# Patient Record
Sex: Female | Born: 1955 | Hispanic: No | Marital: Married | Smoking: Never smoker
Health system: Southern US, Community
[De-identification: ages and names within clinical notes are randomized; demographics above are authoritative.]

## PROBLEM LIST (undated history)

## (undated) DIAGNOSIS — R519 Headache, unspecified: Secondary | ICD-10-CM

## (undated) DIAGNOSIS — R51 Headache: Secondary | ICD-10-CM

## (undated) DIAGNOSIS — R7611 Nonspecific reaction to tuberculin skin test without active tuberculosis: Secondary | ICD-10-CM

## (undated) HISTORY — DX: Nonspecific reaction to tuberculin skin test without active tuberculosis: R76.11

## (undated) HISTORY — DX: Headache, unspecified: R51.9

## (undated) HISTORY — DX: Headache: R51

## (undated) HISTORY — PX: ROTATOR CUFF REPAIR: SHX139

## (undated) HISTORY — PX: TOOTH EXTRACTION: SUR596

---

## 2004-10-14 ENCOUNTER — Ambulatory Visit: Payer: Self-pay | Admitting: Unknown Physician Specialty

## 2004-11-28 ENCOUNTER — Ambulatory Visit: Payer: Self-pay | Admitting: Surgery

## 2008-03-03 ENCOUNTER — Ambulatory Visit: Payer: Self-pay | Admitting: Internal Medicine

## 2009-08-09 ENCOUNTER — Ambulatory Visit: Payer: Self-pay

## 2011-05-15 ENCOUNTER — Emergency Department: Payer: Self-pay | Admitting: Emergency Medicine

## 2013-11-03 ENCOUNTER — Ambulatory Visit: Payer: Self-pay | Admitting: General Practice

## 2014-01-11 ENCOUNTER — Ambulatory Visit: Payer: Self-pay | Admitting: Family Medicine

## 2014-02-22 ENCOUNTER — Ambulatory Visit (INDEPENDENT_AMBULATORY_CARE_PROVIDER_SITE_OTHER): Payer: 59 | Admitting: Family Medicine

## 2014-02-22 ENCOUNTER — Encounter: Payer: Self-pay | Admitting: Family Medicine

## 2014-02-22 ENCOUNTER — Other Ambulatory Visit (HOSPITAL_COMMUNITY)
Admission: RE | Admit: 2014-02-22 | Discharge: 2014-02-22 | Disposition: A | Discharge status: Home / Self Care | Payer: 59 | Source: Ambulatory Visit | Attending: Family Medicine | Admitting: Family Medicine

## 2014-02-22 VITALS — BP 110/68 | HR 71 | Temp 97.6°F | Ht <= 58 in | Wt 139.5 lb

## 2014-02-22 DIAGNOSIS — Z1151 Encounter for screening for human papillomavirus (HPV): Secondary | ICD-10-CM | POA: Insufficient documentation

## 2014-02-22 DIAGNOSIS — Z0000 Encounter for general adult medical examination without abnormal findings: Secondary | ICD-10-CM

## 2014-02-22 DIAGNOSIS — Z1211 Encounter for screening for malignant neoplasm of colon: Secondary | ICD-10-CM

## 2014-02-22 DIAGNOSIS — Z01419 Encounter for gynecological examination (general) (routine) without abnormal findings: Secondary | ICD-10-CM

## 2014-02-22 DIAGNOSIS — Z0001 Encounter for general adult medical examination with abnormal findings: Secondary | ICD-10-CM | POA: Insufficient documentation

## 2014-02-22 NOTE — Progress Notes (Signed)
Subjective:   Patient ID: Amy Sims, female    DOB: [birth year], 57 y.o.   MRN: [id number]  Amy Sims is a pleasant 58 y.o. year old female who presents to clinic today with Establish Care  on 02/22/2014  HPI: G4P3- no h/o abnormal pap smears.  Last pap smear was over 5 years ago.  She would like a CPX today. LMP in 2008.  No h/o post menopausal bleeding. No family h/o breast, uterine, cervical or ovarian CA.  Per pt, mammogram normal in 10/2013.  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  Has never had a colonoscopy and refuses to have one at this point.  There are no active problems to display for this patient.  Past Medical History  Diagnosis Date  . Generalized headaches   . 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 Tooth extraction     History  Substance Use Topics  . Smoking status: Never Smoker   . Smokeless tobacco: Never Used  . Alcohol Use: No   Family History  Problem Relation Age of Onset  . Hypertension Mother   . Cancer Sister 92    non hodgkins lymphoma   Allergies no known allergies No current outpatient prescriptions on file prior to visit.   No current facility-administered medications on file prior to visit.   The PMH, PSH, Social History, Family History, Medications, and allergies have been reviewed in [facility], and have been updated if relevant.   Review of Systems    See HPI Patient reports no  vision/ hearing changes,anorexia, weight change, fever ,adenopathy, persistant / recurrent hoarseness, swallowing issues, chest pain, edema,persistant / recurrent cough, hemoptysis, dyspnea(rest, exertional, paroxysmal nocturnal), gastrointestinal  bleeding (melena, rectal bleeding), abdominal pain, excessive heart burn, GU symptoms(dysuria, hematuria, pyuria, voiding/incontinence  Issues) syncope, focal weakness, severe memory loss, concerning skin lesions,  depression, anxiety, abnormal bruising/bleeding, major joint swelling, breast masses or abnormal vaginal bleeding.    Objective:    BP 110/68  Pulse 71  Temp(Src) 97.6 F (36.4 C) (Oral)  Ht 4' 8.5" (1.435 m)  Wt 139 lb 8 oz (63.277 kg)  BMI 30.73 kg/m2  SpO2 96%   Physical Exam   General:  Well-developed,well-nourished,in no acute distress; alert,appropriate and cooperative throughout examination Head:  normocephalic and atraumatic.   Eyes:  vision grossly intact, pupils equal, pupils round, and pupils reactive to light.   Ears:  R ear normal and L ear normal.   Nose:  no external deformity.   Mouth:  good dentition.   Neck:  No deformities, masses, or tenderness noted. Breasts:  No mass, nodules, thickening, tenderness, bulging, retraction, inflamation, nipple discharge or skin changes noted.   Lungs:  Normal respiratory effort, chest expands symmetrically. Lungs are clear to auscultation, no crackles or wheezes. Heart:  Normal rate and regular rhythm. S1 and S2 normal without gallop, murmur, click, rub or other extra sounds. Abdomen:  Bowel sounds positive,abdomen soft and non-tender without masses, organomegaly or hernias noted. Rectal:  no external abnormalities.   Genitalia:  Pelvic Exam:        External: normal female genitalia without lesions or masses        Vagina: normal without lesions or masses        Cervix: normal without lesions or masses        Adnexa: normal bimanual exam without masses or fullness  Uterus: normal by palpation        Pap smear: performed Msk:  No deformity or scoliosis noted of thoracic or lumbar spine.   Extremities:  No clubbing, cyanosis, edema, or deformity noted with normal full range of motion of all joints.   Neurologic:  alert & oriented X3 and gait normal.   Skin:  Intact without suspicious lesions or rashes Cervical Nodes:  No lymphadenopathy noted Axillary Nodes:  No palpable lymphadenopathy Psych:  Cognition and judgment  appear intact. Alert and cooperative with normal attention span and concentration. No apparent delusions, illusions, hallucinations       Assessment & Plan:   No diagnosis found. No Follow-up on file.

## 2014-02-22 NOTE — Assessment & Plan Note (Signed)
Pap smear done today. Mammogram UTD- awaiting records.

## 2014-02-22 NOTE — Progress Notes (Signed)
Pre visit review using our clinic review tool, if applicable. No additional management support is needed unless otherwise documented below in the visit note. 

## 2014-02-22 NOTE — Assessment & Plan Note (Signed)
Reviewed preventive care protocols, scheduled due services, and updated immunizations Discussed nutrition, exercise, diet, and healthy lifestyle.  Agrees to IFOB.

## 2014-02-22 NOTE — Patient Instructions (Signed)
Nice to meet you. Please stop by the lab to pick up your stool cards.   We will call you or send a letter with your pap smear results.

## 2014-02-22 NOTE — Addendum Note (Signed)
Addended by: Alvina ChouWALSH, TERRI J on: 02/22/2014 11:39 AM   Modules accepted: Orders

## 2014-02-28 ENCOUNTER — Encounter: Payer: Self-pay | Admitting: *Deleted

## 2015-03-23 ENCOUNTER — Encounter
Admit: 2015-03-23 | Disposition: A | Discharge status: Home / Self Care | Payer: Self-pay | Attending: Surgery | Admitting: Surgery

## 2015-03-27 ENCOUNTER — Encounter: Payer: Self-pay | Admitting: *Deleted

## 2015-04-17 ENCOUNTER — Other Ambulatory Visit: Payer: Self-pay | Admitting: Surgery

## 2015-04-17 DIAGNOSIS — M75121 Complete rotator cuff tear or rupture of right shoulder, not specified as traumatic: Secondary | ICD-10-CM

## 2015-04-17 DIAGNOSIS — M7581 Other shoulder lesions, right shoulder: Secondary | ICD-10-CM

## 2015-04-19 ENCOUNTER — Ambulatory Visit: Payer: 59 | Admitting: Physical Therapy

## 2015-04-19 ENCOUNTER — Encounter: Payer: Self-pay | Admitting: Physical Therapy

## 2015-04-19 DIAGNOSIS — M6281 Muscle weakness (generalized): Secondary | ICD-10-CM

## 2015-04-19 DIAGNOSIS — M7581 Other shoulder lesions, right shoulder: Secondary | ICD-10-CM | POA: Insufficient documentation

## 2015-04-19 DIAGNOSIS — M75121 Complete rotator cuff tear or rupture of right shoulder, not specified as traumatic: Secondary | ICD-10-CM | POA: Diagnosis present

## 2015-04-20 ENCOUNTER — Ambulatory Visit
Admission: RE | Admit: 2015-04-20 | Discharge: 2015-04-20 | Disposition: A | Discharge status: Home / Self Care | Payer: 59 | Source: Ambulatory Visit | Attending: Surgery | Admitting: Surgery

## 2015-04-20 DIAGNOSIS — M75121 Complete rotator cuff tear or rupture of right shoulder, not specified as traumatic: Secondary | ICD-10-CM | POA: Insufficient documentation

## 2015-04-20 DIAGNOSIS — M7581 Other shoulder lesions, right shoulder: Secondary | ICD-10-CM

## 2015-04-20 NOTE — Patient Instructions (Addendum)
  Reeducated patient in HEP with instruction to continue with tband shoulder extension/rows; however to limit reps of shoulder IR/ER; Patient expressed that she was doing 5-6 sets of IR/ER tband exercises which could have exacerbated her pain;

## 2015-04-20 NOTE — Therapy (Signed)
[place] MAIN [facility] SERVICES [address] [place], [place], [address] Phone: [phone]   Fax:  [phone]  Physical Therapy Treatment  Patient Details  Name: Teola Felipe MRN: [id number] Date of Birth: 11/05/1956 Referring Provider:  Corky Mull, MD  Encounter Date: 04/19/2015      PT End of Session - 04/20/15 1038    Visit Number 8   Number of Visits 12   Date for PT Re-Evaluation 05/04/15   PT Start Time 1324   PT Stop Time 1628   PT Time Calculation (min) 38 min   Activity Tolerance Patient tolerated treatment well;No increased pain   Behavior During Therapy [facility] for tasks assessed/performed      Past Medical History  Diagnosis Date  . Generalized headaches   . 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 Tooth extraction      There were no vitals filed for this visit.  Visit Diagnosis:  Muscle weakness      Subjective Assessment - 04/19/15 1557    Subjective Patient was getting PT for 4 weeks and last week she was doing well and was discharged from PT; However she called this week and reports that her right shoulder pain started flaring up. She reports pain at rest and with movement. She reports icing shoulder at least 3x a day; She followed up with Dr. Roland Rack who has an MRI scheduled for tomorrow.    Currently in Pain? 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  No   Is the patient reluctant to leave their home because of a fear of falling?  No   Prior Function   Level of Independence Independent with gait;Independent with transfers   Vocation Full time employment  not working now due to RUE shoulder pain;    AROM   Overall AROM Comments RUE shoulder: flexion 180 degrees, abduction: 175 degrees, ER 60 degrees, IR 70 degrees, extension: 50 degrees   Strength   Overall Strength Comments RUE shoulder: flexion: 4/5, abduction: 4-/5, IR: 4/5, ER: 3+/5, Elbow: flexion: 4/5, Ext: 4-/5   Special Tests    Special Tests Rotator Cuff Impingement   Rotator Cuff Impingment tests Neer impingement test;Hawkins- Kennedy test;Lift- off test;Hornblowers Sign;Empty Can test;Full Can test;Drop Arm test;Painful Arc of Motion   Neer Impingement test    Findings Positive   Side Right   Comments painful   Hawkins-Kennedy test   Findings Positive   Side Right   Lift-Off test   Findings Positive   Side Right   Comment painful   Hornblowers Sign   Findings Positive   Side Right   Comment painful   Empty Can test  Findings Positive   Side Right   Comment painful   Full Can test   Findings Negative   Side Right   Drop Arm test   Findings Negative   Side Right   Comment painful, but not drop arm   Painful Arc of Motion   Findings Negative   Side Right   Comments has pain lowering arm, but doesn't have pain during shoulder flexion/abduction        TREATMENT: There-ex: -RUE red tband shoulder IR/ER x10 each; -RUE red tband shoulder extension x10 reps; Please see attached objective measures for patient current level; Also reeducated patient in safety with HEP with instruction to avoid painful ROM for less muscle soreness; Patient denies any tenderness to palpation;  [place]- PT performed continuous [place] to patient's right lateral/posterior shoulder 1.8 w/cm2   x8 min with  biofreeze; Patient does report less discomfort with ROM following [place]; She exhibits good skin integrity without redness or discomfort during modality treatment.                   PT Education - 04/20/15 1035    Education provided Yes   Education Details Safe exercises to do and instruction to not overdo her HEP and focus more on postural control and ice to reduce delayed onset muscle soreness.   Person(s) Educated Patient   Methods Explanation;Demonstration   Comprehension Verbalized understanding             PT Long Term Goals - 04/20/15 1055    PT LONG TERM GOAL #1   Title Patient will decrease Quick DASH score by > 8 points demonstrating reduced self-reported upper extremity disability by 05/04/15   Status Partially Met   PT LONG TERM GOAL #2   Title Patient will be independent with home exercise program for prevention/ self-management of shoulder symptoms/ difficulty  by 05/04/15   Status On-going   PT LONG TERM GOAL #3   Title Pt to report 0/10 resting pain, 1-2/10 pain with self-stretching and strengthening program  by 05/04/15   Status Partially Met               Plan - 04/20/15 1039    Clinical Impression Statement 59 yo Female came to PT with history of complete tear of RUE rotator cuff in mid April 2016. She received 4 weeks of conservative treatment and was doing well. She regained full RUE shoulder ROM and had no pain with movement. She was discharged on 04/12/15 however called on 04/16/15 with increased onset of pain. Patient admitted that she was doing a lot of exercises (tband shoulder ER 5-6 sets of 10 reps) daily and it is believed that she is experiencing pain related to delayed onset muscle soreness. She responded well to ultrasound and reports no increase in pain with ROM but rather only has pain with resistance. Patient would benefit from 2-3 more sessions to reduce pain and improve mobility.   Pt will benefit from skilled therapeutic intervention  in order to improve on the following deficits Impaired flexibility;Improper body mechanics;Pain;Decreased strength;Decreased mobility   Rehab Potential Good   Clinical Impairments Affecting Rehab Potential motivation; gross multiplanar R shoulder weakness   PT Frequency 2x / week   PT Duration 6 weeks   PT Treatment/Interventions Ultrasound;Functional mobility training;Patient/family education;Passive range of motion;Dry needling;Therapeutic activities;Cryotherapy;Electrical Stimulation;Therapeutic exercise;Manual techniques;Moist Heat;Other (comment)  Iontophoresis with 44m/mL   PT Next Visit Plan continue with modalities and gentle strengthening to reduce DOMS   PT Home Exercise  Plan continue with current HEP, but instructed to only do exercises 1-2 times a day and not more.   Consulted and Agree with Plan of Care Patient        Problem List Patient Active Problem List   Diagnosis Date Noted  . Routine general medical examination at a health care facility 02/22/2014  . Encounter for routine gynecological examination 02/22/2014    Alessandra Grout, [place], DPT, #[id number] 04/20/2015 11:33 AM   [place] MAIN [facility] SERVICES [address] [place], [place], [address] Phone: [phone]   Fax:  [phone]

## 2015-04-24 ENCOUNTER — Ambulatory Visit: Payer: 59 | Admitting: Physical Therapy

## 2015-04-24 DIAGNOSIS — M6281 Muscle weakness (generalized): Secondary | ICD-10-CM

## 2015-04-24 DIAGNOSIS — M75121 Complete rotator cuff tear or rupture of right shoulder, not specified as traumatic: Secondary | ICD-10-CM | POA: Diagnosis not present

## 2015-04-24 NOTE — Therapy (Signed)
[place] MAIN [facility] SERVICES [address] [place], [place], [address] Phone: [phone]   Fax:  [phone]  Physical Therapy Treatment  Patient Details  Name: Shaden Higley MRN: [id number] Date of Birth: 1956/09/15 Referring Provider:  Corky Mull, MD  Encounter Date: 04/24/2015      PT End of Session - 04/24/15 1546    Visit Number 9   Number of Visits 12   Date for PT Re-Evaluation 05/04/15   PT Start Time 1033   PT Stop Time 1114   PT Time Calculation (min) 41 min   Activity Tolerance Patient tolerated treatment well;No increased pain   Behavior During Therapy [facility] for tasks assessed/performed      Past Medical History  Diagnosis Date  . Generalized headaches   . 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 Tooth extraction      There were no vitals filed for this visit.  Visit Diagnosis:  Muscle weakness      Subjective Assessment - 04/24/15 1043    Subjective Patient reports that her pain is getting a little better. She has no pain at rest and only has a little pain with certain movements, still complaining of discomfort with tband shoulder ER; Patient denies any tenderness;    Pertinent History increased difficulty taking tshirt off; worst pain over last 24 hours: 7/10; best pain is 0/10   Currently in Pain? No/denies           Warm up on UBE backwards only x3 min (unbilled) PT performed PROM of RUE shoulder: flexion x1 rep full, abductoin x1 rep full, shoulder ER/IR with 90 degrees abduction x10 reps each with discomfort into shoulder IR;  Supine: -RUE rhythmic stabilization with slow moderate pertubations x2-3 min; -BUE chest press 3# x15; -BUE wand shoulder flexion 90 degrees x10 reps (no weight) -RUE shoulder IR isometric 0 degrees abduction 5 sec hold x5, shoulder ER isometric with 60 degrees abduction and horizontal abduction 5 sec hold  x5;  Left sidelying: RUE shoulder ER 1# 2x10;  Standing: RUE large body blade flexion/abduction oscillations x5 reps each;  Reeducated patient in plan of care. Patient required min-moderate verbal/tactile cues for correct exercise technique including to utilize towel roll under elbow during shoulder ER for less discomfort. Patient also required mod Vcs for better positioning for increased strengthening.                         PT Education - 04/24/15 1544    Education provided Yes   Education Details exercise progression including to use a towel under elbow for shoulder abduction during shoulder ER for less discomfort.   Person(s) Educated Patient   Methods Explanation;Demonstration   Comprehension Verbalized understanding;Verbal cues required;Tactile cues required             PT Long Term Goals - 04/20/15 1055    PT LONG TERM GOAL #1   Title Patient will decrease Quick DASH score by > 8 points demonstrating reduced self-reported upper extremity disability by 05/04/15   Status Partially Met   PT LONG TERM GOAL #2   Title Patient will be independent with home exercise program for prevention/ self-management of shoulder symptoms/ difficulty  by 05/04/15   Status On-going   PT LONG TERM GOAL #3   Title Pt to report 0/10 resting pain, 1-2/10 pain with self-stretching  and strengthening program  by 05/04/15   Status Partially Met               Plan - 04/24/15 1546    Clinical Impression Statement Instructed patient in RUE shoulder strengthening within tolerance; patient responded well to cues especially with instruction to utilize towel roll during shoulder ER for better positioning. Patient continues to fatigue quickly with advanced strengthening exercise.   Pt will benefit from skilled therapeutic intervention in order to improve on the following deficits Impaired flexibility;Improper body mechanics;Pain;Decreased strength;Decreased mobility   Rehab  Potential Good   Clinical Impairments Affecting Rehab Potential motivation; gross multiplanar R shoulder weakness   PT Frequency 2x / week   PT Duration 6 weeks   PT Treatment/Interventions Ultrasound;Functional mobility training;Patient/family education;Passive range of motion;Dry needling;Therapeutic activities;Cryotherapy;Electrical Stimulation;Therapeutic exercise;Manual techniques;Moist Heat;Other (comment)  Iontophoresis with 48m/mL   PT Next Visit Plan continue with modalities and gentle strengthening to reduce DOMS   PT Home Exercise Plan continue with current HEP, but instructed to only do exercises 1-2 times a day and not more.   Consulted and Agree with Plan of Care Patient        Problem List Patient Active Problem List   Diagnosis Date Noted  . Routine general medical examination at a health care facility 02/22/2014  . Encounter for routine gynecological examination 02/22/2014    MAlessandra Grout PVirginia DPT, #[phone]/09/2015 3:48 PM   [place] [facility] [address] [address]Phone: [phone]  Fax:  [phone]

## 2015-04-26 ENCOUNTER — Telehealth: Payer: Self-pay | Admitting: Physical Therapy

## 2015-04-26 ENCOUNTER — Encounter: Payer: Self-pay | Admitting: Physical Therapy

## 2015-04-26 ENCOUNTER — Ambulatory Visit: Payer: 59 | Attending: Surgery | Admitting: Physical Therapy

## 2015-04-26 DIAGNOSIS — M6281 Muscle weakness (generalized): Secondary | ICD-10-CM

## 2015-04-26 DIAGNOSIS — M75121 Complete rotator cuff tear or rupture of right shoulder, not specified as traumatic: Secondary | ICD-10-CM | POA: Diagnosis not present

## 2015-04-26 NOTE — Patient Instructions (Addendum)
All Fours Shoulder Press-Up   On all fours, lock elbows and press up at shoulders,( Make sure that red band is around upper back and holding ends in each hand) arching upper back. Return. Repeat _10___ times  Do __2__ sessions per day.  http://cc.exer.us/61   Copyright  VHI. All rights reserved.   Also while standing facing wall with red band in each hand, Keep left hand against wall, but move right hand out to side 5 times, up 5 times and down 5 times while keeping shoulder blades tucked in                Circle (Supine)   Lie on back, holding 2# weight, with elbow straight, move arm in small circle clockwise and counterclockwise, x10 each; Do _2_ sets per session. Do 1-2 times a day and do 5 days a week Copyright  VHI. All rights reserved.  Shoulder Press: Supine with Protraction   Tubing around back, (or use 2# weighted) anchored in extended arms, push fists further toward ceiling from shoulder blades. Repeat 10__ times per set. Do __2 sets per session. Do 5__ sessions per week.  http://tub.exer.us/292   Copyright  VHI. All rights reserved.

## 2015-04-26 NOTE — Telephone Encounter (Signed)
Patient was 30 min late; was going to call patient but then she called and said she was running late. Albertina ParrMargaret E Hopkins, PT, DPT, [phone]#[id number] 04/26/15  9:56 AM

## 2015-04-26 NOTE — Therapy (Signed)
[place] MAIN [facility] SERVICES [address] [place], [place], [address] Phone: [phone]   Fax:  [phone]  Physical Therapy Treatment  Patient Details  Name: Amy Sims MRN: [id number] Date of Birth: [birth year] Referring Provider:  Corky Mull, MD  Encounter Date: 04/26/2015      PT End of Session - 04/26/15 1007    Visit Number 10   Number of Visits 12   Date for PT Re-Evaluation 05/04/15   PT Start Time 1000   PT Stop Time 1045   PT Time Calculation (min) 45 min   Equipment Utilized During Treatment --  pball, tband, handweight   Activity Tolerance Patient tolerated treatment well;No increased pain   Behavior During Therapy [facility] for tasks assessed/performed      Past Medical History  Diagnosis Date  . Generalized headaches   . 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 Tooth extraction      There were no vitals filed for this visit.  Visit Diagnosis:  Muscle weakness      Subjective Assessment - 04/26/15 1003    Subjective Patient reports compliance with HEP; She reports only doing 3 sets of 5 reps of tband ER/IR; Patient reports no pain when resting; She does continue to feel fatigue in her arm especially with household chores.   Currently in Pain? No/denies           Warm up on UBE backwards only x3 min (unbilled)  Pball on wall up/down x10 reps BUE; pball on wall circles clockwise/counterclockwise x5 each with BUE; pball on wall push-ups BUE x10 Small red weighted ball on wall RUE small circles with shoulder protraction x1 min each direction (clockwise/counterclockwise)   Supine: -RUE rhythmic stabilization with slow moderate pertubations 2x1 min; -RUE shoulder protraction 2# 2x10; -RUE small circles 2# 2x10 each direction, clockwise, counterclockwise;  Left sidelying: RUE shoulder ER 1# 2x10;  Kneeling red tband BUE shoulder protraction  x10;  Reeducated patient in HEP, see patient instructions for advanced exercises. Patient required min-moderate verbal/tactile cues for correct exercise technique including to push through BUE for better scaplar control to reduce scapular winging. She did report increased fatigue with advanced exercise but no increase in pain.                        PT Education - 04/26/15 1007    Education provided Yes   Education Details exercise progression and RUE strengthening safety including to avoid more than 3 sets of tband exercise to reduce fatigue/DOMs   Person(s) Educated Patient   Methods Explanation;Demonstration;Verbal cues   Comprehension Verbalized understanding;Verbal cues required             PT Long Term Goals - 04/20/15 1055    PT LONG TERM GOAL #1   Title Patient will decrease Quick DASH score by > 8 points demonstrating reduced self-reported upper extremity disability by 05/04/15   Status Partially Met   PT LONG TERM GOAL #2   Title Patient will be independent with home exercise program for prevention/ self-management of shoulder symptoms/ difficulty  by 05/04/15   Status On-going   PT LONG TERM GOAL #3   Title Pt to report 0/10 resting pain, 1-2/10 pain with self-stretching and strengthening program  by 05/04/15   Status Partially Met  Plan - 04/26/15 1112    Clinical Impression Statement Focused on RUE shoulder strengthening, progressing exercises to stability with pball and small ball exercise. Also instructed patient in shoulder protraction for tband exercise. Patient reports increased fatigue but no increase in pain. She would benefit from additional skilled PT intervention to improve RUE strength and reduce discomfort.   Pt will benefit from skilled therapeutic intervention in order to improve on the following deficits Impaired flexibility;Improper body mechanics;Pain;Decreased strength;Decreased mobility   Rehab Potential Good    Clinical Impairments Affecting Rehab Potential motivation; gross multiplanar R shoulder weakness   PT Frequency 2x / week   PT Duration 6 weeks   PT Treatment/Interventions Ultrasound;Functional mobility training;Patient/family education;Passive range of motion;Dry needling;Therapeutic activities;Cryotherapy;Electrical Stimulation;Therapeutic exercise;Manual techniques;Moist Heat;Other (comment)  Iontophoresis with 34m/mL   PT Next Visit Plan continue with modalities and gentle strengthening to reduce DOMS   PT Home Exercise Plan continue with current HEP, but instructed to only do exercises 1-2 times a day and not more.   Consulted and Agree with Plan of Care Patient        Problem List Patient Active Problem List   Diagnosis Date Noted  . Routine general medical examination at a health care facility 02/22/2014  . Encounter for routine gynecological examination 02/22/2014    MAlessandra Grout PVirginia DPT, #[id number] 04/26/2015 11:27 AM   [place] [facility] [address] [address]Phone: [phone]  Fax:  [phone]

## 2015-05-01 ENCOUNTER — Ambulatory Visit: Payer: 59 | Admitting: Physical Therapy

## 2015-05-01 ENCOUNTER — Encounter: Payer: Self-pay | Admitting: Physical Therapy

## 2015-05-01 NOTE — Therapy (Signed)
[place] MAIN [facility] SERVICES [address] [place], [place], [address] Phone: [phone]   Fax:  [phone]  Discharge Summary  Patient Details  Name: Amy Sims MRN: [id number] Date of Birth: 03-23-1956 Referring Provider:  Dr. Roland Rack Encounter Date: 05/01/2015    Past Medical History  Diagnosis Date  . Generalized headaches   . 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 Tooth extraction      There were no vitals filed for this visit.  Visit Diagnosis:  Right shoulder pain  PHYSICAL THERAPY DISCHARGE SUMMARY  Visits from Start of Care:10 of 12  Current functional level related to goals / functional outcomes: Patient did not attend final session; unable to obtain objective measures; She reports getting MRI and MD recommended rotator cuff repair. She requested discharge from PT at this time.   Remaining deficits: Right shoulder pain   Plan: Patient agrees to discharge.  Patient goals were partially met. Patient is being discharged due to the patient's request.  ?????                                        PT Long Term Goals - 04/20/15 1055    PT LONG TERM GOAL #1   Title Patient will decrease Quick DASH score by > 8 points demonstrating reduced self-reported upper extremity disability by 05/04/15   Status Partially Met   PT LONG TERM GOAL #2   Title Patient will be independent with home exercise program for prevention/ self-management of shoulder symptoms/ difficulty  by 05/04/15   Status On-going   PT LONG TERM GOAL #3   Title Pt to report 0/10 resting pain, 1-2/10 pain with self-stretching and strengthening program  by 05/04/15   Status Partially Met               Problem List Patient Active Problem List   Diagnosis Date Noted  . Routine general medical examination at a health care facility 02/22/2014  . Encounter for  routine gynecological examination 02/22/2014    Alessandra Grout, [place], DPT, [phone] 05/01/2015 3:29 PM   [place] MAIN [facility] SERVICES [address] [place], [place], [address] Phone: [phone]   Fax:  [phone]

## 2015-05-15 ENCOUNTER — Inpatient Hospital Stay: Admission: RE | Admit: 2015-05-15 | Payer: 59 | Source: Ambulatory Visit

## 2015-05-22 ENCOUNTER — Ambulatory Visit: Admission: RE | Admit: 2015-05-22 | Payer: 59 | Source: Ambulatory Visit | Admitting: Surgery

## 2015-05-22 ENCOUNTER — Encounter: Admission: RE | Payer: Self-pay | Source: Ambulatory Visit

## 2015-05-22 SURGERY — ARTHROSCOPY, SHOULDER
Anesthesia: Choice | Laterality: Right

## 2015-06-27 ENCOUNTER — Ambulatory Visit: Payer: 59 | Attending: Surgery | Admitting: Physical Therapy

## 2015-06-27 ENCOUNTER — Encounter: Payer: Self-pay | Admitting: Physical Therapy

## 2015-06-27 DIAGNOSIS — Z9889 Other specified postprocedural states: Secondary | ICD-10-CM | POA: Insufficient documentation

## 2015-06-27 DIAGNOSIS — M2560 Stiffness of unspecified joint, not elsewhere classified: Secondary | ICD-10-CM | POA: Diagnosis not present

## 2015-06-27 DIAGNOSIS — R29898 Other symptoms and signs involving the musculoskeletal system: Secondary | ICD-10-CM

## 2015-06-27 DIAGNOSIS — M6281 Muscle weakness (generalized): Secondary | ICD-10-CM | POA: Diagnosis not present

## 2015-06-27 NOTE — Therapy (Addendum)
[place] [facility] MAIN [facility] SERVICES [address] [place], [place], [address] Phone: [phone]   Fax:  [phone]  Physical Therapy Evaluation  Patient Details  Name: Amy Sims MRN: [id number] Date of Birth: [birth year] Referring Provider:  Christena Flake, MD  Encounter Date: 06/27/2015    Past Medical History  Diagnosis Date  . Generalized headaches   . 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 Tooth extraction      There were no vitals filed for this visit.  Visit Diagnosis:  Weakness of shoulder - Plan: PT plan of care cert/re-cert  Stiffness in joint - Plan: PT plan of care cert/re-cert      Subjective Assessment - 06/27/15 1659    Subjective Patient fell and hurt her righ shoulder april 4th 2016.    Currently in Pain? Yes   Pain Score 1    Pain Location Shoulder   Pain Orientation Right   Pain Descriptors / Indicators Throbbing   Pain Type Acute pain   Pain Onset 1 to 4 weeks ago   Pain Frequency Constant            [facility] PT Assessment - 06/27/15 1701    Assessment   Medical Diagnosis right rotator cuff repair   Onset Date/Surgical Date 06/11/15   Hand Dominance Right   Next MD Visit August 12,2016   Prior Therapy yes   Precautions   Precautions Fall   Balance Screen   Has the patient fallen in the past 6 months Yes   How many times? 1   Has the patient had a decrease in activity level because of a fear of falling?  Yes   Is the patient reluctant to leave their home because of a fear of falling?  Yes   Home Environment   Living Environment Private residence   Living Arrangements Children   Available Help at Discharge Family   Type of Home House   Home Access Stairs to enter   Entrance Stairs-Number of Steps 4   Entrance Stairs-Rails Right   Home Layout Two level   Prior Function   Level of Independence Independent       Patient has had  rotator cuff surgery 06/11/15. Her precautions are: sling for 6 weeks,  PROM  Weeks 1-6 140 deg FF, 40 ER at side, ABD max 60-80 without rotation, encourage elbow ROM including pronation and supination. At 4 weeks begin AROM at the elbow and with Passive stretching at the end ranges of elbow motion.  August 8 is 6 weeks and can begin AAROM to AROM. PROM flex 0-40, abd 0-30, ER 0, IR 0-40 Quick dash is 84%                         PT Long Term Goals - 06/27/15 1812    PT LONG TERM GOAL #1   Title Patient will decrease Quick DASH score by > 8 points demonstrating reduced self-reported upper extremity disability by 09/29/15   Status New   PT LONG TERM GOAL #2   Title Patient will be independent with home exercise program for prevention/ self-management of shoulder symptoms/ difficulty  by 09/29/15   Status New   PT LONG TERM GOAL #3   Title Pt to report 0/10 resting pain, 1-2/10 pain with self-stretching and strengthening program  by 09/29/15   Status  New               Plan - 06/27/15 1815    Clinical Impression Statement Patient is 59 yr old female who presents with s/p rotator cuff surgery 06/11/15. She has poor strength and poor ROM to right shoulder and has 3/10 pain that is constant. She is in a sling and has a quick dash of 84%.   Pt will benefit from skilled therapeutic intervention in order to improve on the following deficits Decreased activity tolerance;Decreased range of motion;Decreased strength;Impaired flexibility;Impaired UE functional use;Pain   Rehab Potential Good   PT Frequency 2x / week   PT Duration 12 weeks   PT Treatment/Interventions Ultrasound;Cryotherapy;Electrical Stimulation;Moist Heat;Therapeutic exercise;Therapeutic activities;Passive range of motion;Dry needling   PT Next Visit Plan strengthening, PROM          Problem List Patient Active Problem List   Diagnosis Date Noted  . Routine general medical examination at a health care  facility 02/22/2014  . Encounter for routine gynecological examination 02/22/2014    Ezekiel InaMansfield, Jaylan Duggar S 06/28/2015, 2:04 PM  [place] [facility] [address] [place], [place], [address] Phone: [phone]   Fax:  [phone]

## 2015-06-28 NOTE — Therapy (Signed)
[place] [facility] MAIN [facility] SERVICES [address] [place], [place], [address] Phone: [phone]   Fax:  [phone]  Physical Therapy Evaluation  Patient Details  Name: Amy Sims MRN: [id number] Date of Birth: [birth year] Referring Provider:  Christena Flake, MD  Encounter Date: 06/27/2015    Past Medical History  Diagnosis Date  . Generalized headaches   . 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 Tooth extraction      There were no vitals filed for this visit.  Visit Diagnosis:  Weakness of shoulder - Plan: PT plan of care cert/re-cert  Stiffness in joint - Plan: PT plan of care cert/re-cert      Subjective Assessment - 06/27/15 1659    Subjective Patient fell and hurt her righ shoulder april 4th 2016.    Currently in Pain? Yes   Pain Score 1    Pain Location Shoulder   Pain Orientation Right   Pain Descriptors / Indicators Throbbing   Pain Type Acute pain   Pain Onset 1 to 4 weeks ago   Pain Frequency Constant            [facility] PT Assessment - 06/27/15 1701    Assessment   Medical Diagnosis right rotator cuff repair   Onset Date/Surgical Date 06/11/15   Hand Dominance Right   Next MD Visit August 12,2016   Prior Therapy yes   Precautions   Precautions Fall   Balance Screen   Has the patient fallen in the past 6 months Yes   How many times? 1   Has the patient had a decrease in activity level because of a fear of falling?  Yes   Is the patient reluctant to leave their home because of a fear of falling?  Yes   Home Environment   Living Environment Private residence   Living Arrangements Children   Available Help at Discharge Family   Type of Home House   Home Access Stairs to enter   Entrance Stairs-Number of Steps 4   Entrance Stairs-Rails Right   Home Layout Two level   Prior Function   Level of Independence Independent                                 PT Long Term Goals - 06/27/15 1812    PT LONG TERM GOAL #1   Title Patient will decrease Quick DASH score by > 8 points demonstrating reduced self-reported upper extremity disability by 09/29/15   Status New   PT LONG TERM GOAL #2   Title Patient will be independent with home exercise program for prevention/ self-management of shoulder symptoms/ difficulty  by 09/29/15   Status New   PT LONG TERM GOAL #3   Title Pt to report 0/10 resting pain, 1-2/10 pain with self-stretching and strengthening program  by 09/29/15   Status New               Plan - 06/27/15 1815    Clinical Impression Statement Patient is 59 yr old female who presents with s/p rotator cuff surgery 06/11/15. She has poor strength and poor ROM to right shoulder and has 3/10 pain that is constant. She is in a sling and has a quick dash of 84%.   Pt will benefit from skilled therapeutic intervention in order to improve  on the following deficits Decreased activity tolerance;Decreased range of motion;Decreased strength;Impaired flexibility;Impaired UE functional use;Pain   Rehab Potential Good   PT Frequency 2x / week   PT Duration 12 weeks   PT Treatment/Interventions Ultrasound;Cryotherapy;Electrical Stimulation;Moist Heat;Therapeutic exercise;Therapeutic activities;Passive range of motion   PT Next Visit Plan strengthening, PROM          Problem List Patient Active Problem List   Diagnosis Date Noted  . Routine general medical examination at a health care facility 02/22/2014  . Encounter for routine gynecological examination 02/22/2014    Ezekiel InaMansfield, Midge Momon S 06/28/2015, 1:53 PM  [place] [facility] [address] [place], [place], [address] Phone: [phone]   Fax:  [phone]

## 2015-06-28 NOTE — Addendum Note (Signed)
Addended by: Ezekiel InaMANSFIELD, Mishael Haran S on: 06/28/2015 02:12 PM   Modules accepted: Orders

## 2015-07-02 ENCOUNTER — Ambulatory Visit: Payer: 59

## 2015-07-02 DIAGNOSIS — R29898 Other symptoms and signs involving the musculoskeletal system: Secondary | ICD-10-CM

## 2015-07-02 DIAGNOSIS — M6281 Muscle weakness (generalized): Secondary | ICD-10-CM | POA: Diagnosis not present

## 2015-07-02 DIAGNOSIS — M2560 Stiffness of unspecified joint, not elsewhere classified: Secondary | ICD-10-CM

## 2015-07-02 NOTE — Therapy (Signed)
[place] [facility] [address] [place], [place], [address] Phone: [phone]   Fax:  [phone]  Physical Therapy Treatment  Patient Details  Name: Amy Sims MRN: [id number] Date of Birth: 1956/02/10 Referring Provider:  Dianne DunAron, Talia M, MD  Encounter Date: 07/02/2015      PT End of Session - 07/02/15 1736    Visit Number 2   Number of Visits 25   Date for PT Re-Evaluation 07/25/15   PT Start Time 1700   PT Stop Time 1730   PT Time Calculation (min) 30 min   Equipment Utilized During Treatment --  sling   Activity Tolerance Patient tolerated treatment well   Behavior [facility] Therapy [facility] for tasks assessed/performed      Past Medical History  Diagnosis Date  . Generalized headaches   . 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 Tooth extraction      There were no vitals filed for this visit.  Visit Diagnosis:  Weakness of shoulder  Muscle weakness  Stiffness in joint      Subjective Assessment - 07/02/15 1728    Subjective pt reports she has been having less pain in her R shoulder. she has been compliant with protocol.    Patient Stated Goals full use of arm   Currently in Pain? No/denies   Pain Onset 1 to 4 weeks ago                         [facility] - 07/02/15 0001    Exercises   Exercises Shoulder;Elbow   Elbow Exercises   Elbow Flexion AROM;10 reps  cues for full elbow extension. shoulder neutral   Shoulder Exercises: Standing   Other Standing Exercises --  shoulder pendulums- modified for flexion only x 10   Manual Therapy   Manual Therapy Passive ROM   Manual therapy comments pt required min-mod cues to relax during PROM. pt had minimal guarding.    Passive ROM PROM of R shoulder into flexion, scaption, abduction, ER/IR x 15 each way . PROM to R elbow also with gentle over pressure into  extension  with bicep stretch (gentle) x 10     pt refused ice at end of session, although she reported slight increase in pain.            PT Education - 07/02/15 1734    Education provided Yes   Education Details elbow              PT Long Term Goals - 06/27/15 1812    PT LONG TERM GOAL #1   Title Patient will decrease Quick DASH score by > 8 points demonstrating reduced self-reported upper extremity disability by 09/29/15   Status New   PT LONG TERM GOAL #2   Title Patient will be independent with home exercise program for prevention/ self-management of shoulder symptoms/ difficulty  by 09/29/15   Status New   PT LONG TERM GOAL #3   Title Pt to report 0/10 resting pain, 1-2/10 pain with self-stretching and strengthening program  by 09/29/15   Status New               Plan - 07/02/15 1738    Clinical Impression Statement pt did well with PROM of the shoulder today. she is a little guarded, but was able to  relax fairly well with cues. progressed HEP to include PROM pendulums and elbow AROM with emphasis on elbow extension to prevent biceps contracture. emphasized protocol today with re-instuct no AROM of the R shoulder. pt demonstrates independence donning/doffing sling while adhering to precautions.    Pt will benefit from skilled therapeutic intervention in order to improve on the following deficits Decreased activity tolerance;Decreased range of motion;Decreased strength;Impaired flexibility;Impaired UE functional use;Pain   Rehab Potential Good   PT Frequency 2x / week   PT Duration 12 weeks   PT Treatment/Interventions Ultrasound;Cryotherapy;Electrical Stimulation;Moist Heat;Therapeutic exercise;Therapeutic activities;Passive range of motion;Dry needling   PT Next Visit Plan progress PROM as tolerated. review pendulums elbow AROM as needed.         Problem List Patient Active Problem List   Diagnosis Date Noted  . Routine general medical examination at  a health care facility 02/22/2014  . Encounter for routine gynecological examination 02/22/2014   Carlyon Shadow. Cregg Jutte, PT, DPT [phone]  Yaron Grasse 07/02/2015, 5:41 PM  [facility] MAIN [facility] SERVICES [address] [place], [place], [address] Phone: [phone]   Fax:  [phone]

## 2015-07-04 ENCOUNTER — Encounter: Payer: Self-pay | Admitting: Physical Therapy

## 2015-07-04 ENCOUNTER — Ambulatory Visit: Payer: 59 | Admitting: Physical Therapy

## 2015-07-04 DIAGNOSIS — R29898 Other symptoms and signs involving the musculoskeletal system: Secondary | ICD-10-CM

## 2015-07-04 DIAGNOSIS — M6281 Muscle weakness (generalized): Secondary | ICD-10-CM

## 2015-07-04 DIAGNOSIS — M2560 Stiffness of unspecified joint, not elsewhere classified: Secondary | ICD-10-CM

## 2015-07-04 NOTE — Therapy (Signed)
[place] [facility] MAIN [place] SERVICES [address] [place], [place], [address] Phone: [phone]   Fax:  [phone]  Physical Therapy Treatment  Patient Details  Name: Lynnix Schoneman MRN: [id number] Date of Birth: 1956-06-14 Referring Provider:  Dianne Dun, MD  Encounter Date: 07/04/2015      PT End of Session - 07/04/15 1111    Visit Number 3   Number of Visits 25   Date for PT Re-Evaluation 07/25/15   PT Start Time 1105   PT Stop Time 1143   PT Time Calculation (min) 38 min   Equipment Utilized During Treatment --  sling   Activity Tolerance Patient tolerated treatment well   Behavior During Therapy [facility] for tasks assessed/performed      Past Medical History  Diagnosis Date  . Generalized headaches   . 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 Tooth extraction      There were no vitals filed for this visit.  Visit Diagnosis:  Weakness of shoulder  Muscle weakness  Stiffness in joint      Subjective Assessment - 07/04/15 1110    Subjective pt reports she has been having less pain in her R shoulder. she has been compliant with protocol. Patient is wearing her sling. She has more pain at night time.    Patient Stated Goals full use of arm   Pain Score 4    Pain Location Shoulder   Pain Orientation Right   Pain Descriptors / Indicators Throbbing   Pain Onset 1 to 4 weeks ago       Therapeutic exercise; PROM to RUE shoulder including flex/abd/ER/IR x 10 reps x 2 sets Mobilization to right scapula in sidelying with PROM to right shoulder flex x 10 reps x 2 sets Pendulum exercises to RUE horizontal and vertical motion and circular, AROM to right elbow Patient is limited by pain during PROM for all mobility. Ice to right shoulder following PROM and TENS during treatment per home unit                          PT Education - 07/04/15 1111     Education provided Yes   Education Details HEP    Person(s) Educated Patient   Methods Explanation   Comprehension Verbalized understanding             PT Long Term Goals - 06/27/15 1812    PT LONG TERM GOAL #1   Title Patient will decrease Quick DASH score by > 8 points demonstrating reduced self-reported upper extremity disability by 09/29/15   Status New   PT LONG TERM GOAL #2   Title Patient will be independent with home exercise program for prevention/ self-management of shoulder symptoms/ difficulty  by 09/29/15   Status New   PT LONG TERM GOAL #3   Title Pt to report 0/10 resting pain, 1-2/10 pain with self-stretching and strengthening program  by 09/29/15   Status New               Plan - 07/04/15 1123    Clinical Impression Statement Patient has increasaed pain with PROM to right shoulder flex/abd/ER/IR . She did not take any pain medicine today. She has PROM 0-45 deg abd, 0-45 deg flex, 0 deg ER and 40 deg IR. She is able to perform pendulum exercise for RUE.  Pt will benefit from skilled therapeutic intervention in order to improve on the following deficits Decreased activity tolerance;Decreased range of motion;Decreased strength;Impaired flexibility;Impaired UE functional use;Pain   Rehab Potential Good   PT Frequency 2x / week   PT Duration 12 weeks   PT Treatment/Interventions Ultrasound;Cryotherapy;Electrical Stimulation;Moist Heat;Therapeutic exercise;Therapeutic activities;Passive range of motion;Dry needling   PT Next Visit Plan progress PROM as tolerated. review pendulums elbow AROM as needed.         Problem List Patient Active Problem List   Diagnosis Date Noted  . Routine general medical examination at a health care facility 02/22/2014  . Encounter for routine gynecological examination 02/22/2014    Ezekiel InaMansfield, Brandilynn Taormina S 07/04/2015, 11:28 AM  [place] [facility] [address] , [place], [address] Phone: [phone]   Fax:  [phone]

## 2015-07-09 ENCOUNTER — Ambulatory Visit: Payer: 59

## 2015-07-09 DIAGNOSIS — M6281 Muscle weakness (generalized): Secondary | ICD-10-CM

## 2015-07-09 DIAGNOSIS — R29898 Other symptoms and signs involving the musculoskeletal system: Secondary | ICD-10-CM

## 2015-07-09 DIAGNOSIS — M2560 Stiffness of unspecified joint, not elsewhere classified: Secondary | ICD-10-CM

## 2015-07-09 NOTE — Therapy (Signed)
[place] [facility] MAIN [facility] SERVICES [address] [place], [place], [address] Phone: [phone]   Fax:  [phone]  Physical Therapy Treatment  Patient Details  Name: Amy Sims MRN: [id number] Date of Birth: [birth year] Referring Provider:  Dianne Dun, MD  Encounter Date: 07/09/2015      PT End of Session - 07/09/15 1647    Visit Number 4   Number of Visits 25   Date for PT Re-Evaluation 07/25/15   PT Start Time 1600   PT Stop Time 1642   PT Time Calculation (min) 42 min   Equipment Utilized During Treatment --  sling   Activity Tolerance Patient tolerated treatment well   Behavior During Therapy [facility] for tasks assessed/performed      Past Medical History  Diagnosis Date  . Generalized headaches   . 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 Tooth extraction      There were no vitals filed for this visit.  Visit Diagnosis:  Weakness of shoulder  Muscle weakness  Stiffness in joint      Subjective Assessment - 07/09/15 1642    Subjective pt reports she has no pain at rest. pt reports she is doing well. she is compliant with HEP   Patient Stated Goals full use of arm   Currently in Pain? No/denies   Pain Score --  5/10 with movement   Pain Location Shoulder   Pain Orientation Right   Pain Type Surgical pain   Pain Onset 1 to 4 weeks ago                         [facility] Adult PT Treatment/Exercise - 07/09/15 0001    Exercises   Exercises Shoulder;Elbow   Elbow Exercises   Elbow Flexion AROM;10 reps  cues for full elbow extension. shoulder neutral   Shoulder Exercises: Supine   Other Supine Exercises shoulder ER PROM with wand x 15, min cues   Shoulder Exercises: Standing   Other Standing Exercises --   Manual Therapy   Manual Therapy Passive ROM;Joint mobilization;Soft tissue mobilization   Manual therapy comments pt required min-mod  cues to relax during PROM. pt had minimal guarding.    Joint Mobilization grade 1-2 inferior and posterior GH mobs 3x15 oscillations   Soft tissue mobilization STM to L biceps, deltoid and teres minor, efflurage and muscle kneading.   improved ROM following    Passive ROM PROM of R shoulder into flexion, extension, scaption, abduction, ER/IR x 15 each way . PROM to R elbow also with gentle over pressure into extension  with bicep stretch (gentle) x 10       cold pack applied to R shoulder following treatment x 10 min no charge            PT Education - 07/09/15 1646    Education provided Yes   Education Details HEP, progression of protocol in 2 weekis   Person(s) Educated Patient   Methods Explanation   Comprehension Verbalized understanding             PT Long Term Goals - 06/27/15 1812    PT LONG TERM GOAL #1   Title Patient will decrease Quick DASH score by > 8 points demonstrating reduced self-reported upper extremity disability by 09/29/15   Status New   PT LONG TERM GOAL #2  Title Patient will be independent with home exercise program for prevention/ self-management of shoulder symptoms/ difficulty  by 09/29/15   Status New   PT LONG TERM GOAL #3   Title Pt to report 0/10 resting pain, 1-2/10 pain with self-stretching and strengthening program  by 09/29/15   Status New               Plan - 07/09/15 1647    Clinical Impression Statement pt has guarding towards end ranges of motion. gentle over pressure applied today to improve ROM. pt also repsonded well to [facility] with improved ROM following. progressed HEP to include passive ER to help regain ROM. pt mentioned trying to lift her arm. pt again advised to stick to protocol including no active motion of the shoulder.    Pt will benefit from skilled therapeutic intervention in order to improve on the following deficits Decreased activity tolerance;Decreased range of motion;Decreased strength;Impaired  flexibility;Impaired UE functional use;Pain   Rehab Potential Good   PT Frequency 2x / week   PT Duration 12 weeks   PT Treatment/Interventions Ultrasound;Cryotherapy;Electrical Stimulation;Moist Heat;Therapeutic exercise;Therapeutic activities;Passive range of motion;Dry needling   PT Next Visit Plan progress PROM as tolerated. review pendulums elbow AROM as needed.         Problem List Patient Active Problem List   Diagnosis Date Noted  . Routine general medical examination at a health care facility 02/22/2014  . Encounter for routine gynecological examination 02/22/2014  Carlyon Shadow. Alvis Pulcini, PT, DPT [phone]   Amy Sims 07/09/2015, 4:49 PM  [place] [facility] MAIN [facility] SERVICES [address] [place], [place], [address] Phone: [phone]   Fax:  [phone]

## 2015-07-09 NOTE — Patient Instructions (Signed)
[email or web address] Pt performed PROM shoulder ER using wand in supine x 10, x 5 in sitting Asked to perform 2x/day, with 5s hold

## 2015-07-11 ENCOUNTER — Ambulatory Visit: Payer: 59

## 2015-07-11 DIAGNOSIS — R29898 Other symptoms and signs involving the musculoskeletal system: Secondary | ICD-10-CM

## 2015-07-11 DIAGNOSIS — M6281 Muscle weakness (generalized): Secondary | ICD-10-CM

## 2015-07-11 DIAGNOSIS — M2560 Stiffness of unspecified joint, not elsewhere classified: Secondary | ICD-10-CM

## 2015-07-11 NOTE — Therapy (Signed)
[place] [facility] MAIN [facility] SERVICES [address] [place], [place], [address] Phone: [phone]   Fax:  [phone]  Physical Therapy Treatment  Patient Details  Name: Amy Sims MRN: [id number] Date of Birth: 1956-11-03 Referring Provider:  Selena Lesser, MD  Encounter Date: 07/11/2015      PT End of Session - 07/11/15 1602    Visit Number 5   Number of Visits 25   Date for PT Re-Evaluation 07/25/15   PT Start Time 1430   PT Stop Time 1500   PT Time Calculation (min) 30 min   Equipment Utilized During Treatment --  sling   Activity Tolerance Patient tolerated treatment well   Behavior During Therapy [facility] for tasks assessed/performed      Past Medical History  Diagnosis Date  . Generalized headaches   . 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 Tooth extraction      There were no vitals filed for this visit.  Visit Diagnosis:  Weakness of shoulder  Muscle weakness  Stiffness in joint      Subjective Assessment - 07/11/15 1600    Subjective pt reports she hasnt been using the TENS. she reports she has a little  more shoulder pain today.    Patient Stated Goals full use of arm   Currently in Pain? Yes   Pain Score 4    Pain Location --  R shoulder   Pain Onset 1 to 4 weeks ago                         [facility] Adult PT Treatment/Exercise - 07/11/15 0001                         Manual Therapy   Manual Therapy Passive ROM;Joint mobilization;Soft tissue mobilization   Manual therapy comments pt required min-mod cues to relax during PROM. pt had minimal guarding.            Passive ROM PROM of R shoulder into flexion, extension, scaption, abduction, ER/IR x 20 each way . PROM to R elbow also with gentle over pressure into extension  with bicep stretch (gentle) x 10    Treatment followed by ice x 10 min in sitting- no charge            PT Education - 07/11/15 1601    Education provided Yes   Education Details adhere to protocol and PT recommendations. pt asked to sleep out of sling. PT advised to stay in sling until MD oks to come out              PT Long Term Goals - 06/27/15 1812    PT LONG TERM GOAL #1   Title Patient will decrease Quick DASH score by > 8 points demonstrating reduced self-reported upper extremity disability by 09/29/15   Status New   PT LONG TERM GOAL #2   Title Patient will be independent with home exercise program for prevention/ self-management of shoulder symptoms/ difficulty  by 09/29/15   Status New   PT LONG TERM GOAL #3   Title Pt to report 0/10 resting pain, 1-2/10 pain with self-stretching and strengthening program  by 09/29/15   Status New               Plan - 07/11/15 6962  Clinical Impression Statement pt had more pain today. pt unable to achieve R shoulder abd/flexion PROM to 90 deg today likely due to pain as there was empty end-feel. pt did have improved ER/IR PROM however. pt urged to stick to protocol and PT exercise recommendation with emphasis on reps (only 10 at a time) and only to perform 2x/day., as she reports doing exercises 3-5x daily, possibly irritating the shoulder.    Pt will benefit from skilled therapeutic intervention in order to improve on the following deficits Decreased activity tolerance;Decreased range of motion;Decreased strength;Impaired flexibility;Impaired UE functional use;Pain   Rehab Potential Good   PT Frequency 2x / week   PT Duration 12 weeks   PT Treatment/Interventions Ultrasound;Cryotherapy;Electrical Stimulation;Moist Heat;Therapeutic exercise;Therapeutic activities;Passive range of motion;Dry needling   PT Next Visit Plan progress PROM as tolerated. review pendulums elbow AROM as needed.         Problem List Patient Active Problem List   Diagnosis Date Noted  . Routine general medical examination at a health care facility  02/22/2014  . Encounter for routine gynecological examination 02/22/2014   Carlyon Shadow. Consetta Cosner, PT, DPT [phone]  Kellsie Grindle 07/11/2015, 4:04 PM  [place] [facility] MAIN [facility] SERVICES [address] [place], [place], [address] Phone: [phone]   Fax:  [phone]

## 2015-07-16 ENCOUNTER — Ambulatory Visit: Payer: 59 | Attending: Orthopaedic Surgery

## 2015-07-16 DIAGNOSIS — M6281 Muscle weakness (generalized): Secondary | ICD-10-CM | POA: Diagnosis present

## 2015-07-16 DIAGNOSIS — R29898 Other symptoms and signs involving the musculoskeletal system: Secondary | ICD-10-CM | POA: Insufficient documentation

## 2015-07-16 DIAGNOSIS — M2560 Stiffness of unspecified joint, not elsewhere classified: Secondary | ICD-10-CM | POA: Diagnosis present

## 2015-07-17 NOTE — Therapy (Signed)
[place] MAIN [facility] SERVICES [address] [place], [place], [address] Phone: [phone]   Fax:  [phone]  Physical Therapy Treatment Physical Therapy Progress Note 06/28/15 to 07/16/15  Patient Details  Name: Amy Sims MRN: [id number] Date of Birth: 26-Feb-1956 Referring Provider:  Otila Back, MD  Encounter Date: 07/16/2015      PT End of Session - 07/17/15 7425    Visit Number 6   Number of Visits 25   Date for PT Re-Evaluation 08/14/15   PT Start Time 9563   PT Stop Time 1815   PT Time Calculation (min) 30 min   Equipment Utilized During Treatment --  sling   Activity Tolerance Patient tolerated treatment well   Behavior During Therapy [facility] for tasks assessed/performed      Past Medical History  Diagnosis Date  . Generalized headaches   . 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 Tooth extraction      There were no vitals filed for this visit.  Visit Diagnosis:  Weakness of shoulder - Plan: PT plan of care cert/re-cert  Muscle weakness - Plan: PT plan of care cert/re-cert  Stiffness in joint - Plan: PT plan of care cert/re-cert      Subjective Assessment - 07/17/15 0820    Subjective pt reports her pain is improving. she reports she has been using TENS and hasnt been taking as many pain meds. reports compliance to HEP and sling immobilization.    Patient Stated Goals full use of arm   Currently in Pain? Yes   Pain Score 4    Pain Location --  R shoulder   Pain Onset 1 to 4 weeks ago      Manual therapy: PROM to the R shoulder flexion, abduction, scaption, ER, IR with gentle over pressure into end ranges.  Gentle long axis distraction grade 1-2 10s bouts between reps intermittantly for relaxation therex (no charge) Standing reverse shoulder rolls (no GH movement)      PROM: (flexion / abduction limited by pain (empty end feel)) ( ER with  capsular end feel)  flexion 100 deg, Abd 90 deg, scaption 100 deg, ER 15 deg.                       PT Education - 07/17/15 0820    Education provided Yes   Education Details adhere to protocol and MD/PT recommendations.   Person(s) Educated Patient   Methods Explanation   Comprehension Verbalized understanding             PT Long Term Goals - 07/17/15 8756    PT LONG TERM GOAL #1   Title Patient will decrease Quick DASH score by > 8 points demonstrating reduced self-reported upper extremity disability    Time 12   Period Weeks   Status On-going   PT LONG TERM GOAL #2   Title Patient will be independent with home exercise program for prevention/ self-management of shoulder symptoms/ difficulty    Time 12   Period Weeks   Status Partially Met   PT LONG TERM GOAL #3   Title Pt to report 0/10 resting pain, 1-2/10 pain with self-stretching and strengthening program     Status On-going   PT LONG TERM GOAL #4   Title pt will reach into over head cabinet to retrieve 2lb itm x 5 with minimal shoulder  compensation and pain <3/10   Time 12   Period Weeks   Status New   PT LONG TERM GOAL #5   Title pt will achieve flexion PROM to 140 deg, Abd to 150 deg, ER to 45 deg demonstrating functional ROM progress.    Time 4   Period Weeks   Status New               Plan - 07/17/15 2951    Clinical Impression Statement PT continues to adhere to s/p full RTC repair protocol, with PROM to the R shoulder only. pt has empty end feel in both flexion/ abduction ROM, limited by pain. pt has more capsular end feel into ER as well as more restriction in this motion. pt has been compliant with HEP per her report. plan to progress to AAROM phase  next week. shoulder PROM: flexion 100 deg, Abd 90 deg, scaption 100 deg, ER 15 deg.   Pt will benefit from skilled therapeutic intervention in order to improve on the following deficits Decreased activity tolerance;Decreased range of  motion;Decreased strength;Impaired flexibility;Impaired UE functional use;Pain   Rehab Potential Good   PT Frequency 2x / week   PT Duration 12 weeks   PT Treatment/Interventions Ultrasound;Cryotherapy;Electrical Stimulation;Moist Heat;Therapeutic exercise;Therapeutic activities;Passive range of motion;Dry needling   PT Next Visit Plan progress PROM as tolerated. review pendulums elbow AROM as needed.         Problem List Patient Active Problem List   Diagnosis Date Noted  . Routine general medical examination at a health care facility 02/22/2014  . Encounter for routine gynecological examination 02/22/2014   Gorden Harms. Salman Wellen, PT, DPT [phone]   Jayceion Lisenby 07/17/2015, 8:40 AM  [place] MAIN [facility] SERVICES [address] [place], [place], [address] Phone: [phone]   Fax:  [phone]

## 2015-07-26 ENCOUNTER — Ambulatory Visit: Payer: 59

## 2015-07-26 DIAGNOSIS — M2560 Stiffness of unspecified joint, not elsewhere classified: Secondary | ICD-10-CM

## 2015-07-26 DIAGNOSIS — M6281 Muscle weakness (generalized): Secondary | ICD-10-CM

## 2015-07-26 DIAGNOSIS — R29898 Other symptoms and signs involving the musculoskeletal system: Secondary | ICD-10-CM | POA: Diagnosis not present

## 2015-07-26 NOTE — Patient Instructions (Signed)
[email or web address] Shoulder AAROM into flexion in supine (hands clasped)  X 10 Table slides flexion/abduction x 10 each way Scapular retraction 5s x 10

## 2015-07-26 NOTE — Therapy (Signed)
[place] MAIN [facility] SERVICES [address] [place], [place], [address] Phone: [phone]   Fax:  [phone]  Physical Therapy Treatment Physical Therapy Progress Note 06/29/15 to 07/26/15  Patient Details  Name: Amy Sims MRN: [id number] Date of Birth: 09-Nov-1956 Referring Provider:  Otila Back, MD  Encounter Date: 07/26/2015      PT End of Session - 07/26/15 1223    Visit Number 7   Number of Visits 25   Date for PT Re-Evaluation 08/14/15   PT Start Time 1130   PT Stop Time 1210   PT Time Calculation (min) 40 min   Equipment Utilized During Treatment --  sling   Activity Tolerance Patient tolerated treatment well   Behavior During Therapy [facility] for tasks assessed/performed      Past Medical History  Diagnosis Date  . Generalized headaches   . 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 Tooth extraction      There were no vitals filed for this visit.  Visit Diagnosis:  Weakness of shoulder  Muscle weakness  Stiffness in joint      Subjective Assessment - 07/26/15 1221    Subjective pt reports she is compliant to HEP. she reports her pain is better. she reports she is "tired of being handicapped"    Patient Stated Goals full use of arm   Currently in Pain? Yes   Pain Score 2    Pain Location --  R shoulder   Pain Onset 1 to 4 weeks ago     manual therapy: Grade I-II GH joint mobilizations AP and inferior glides 3x15 oscillations AC and [place] joint mobs grade III 3x15 oscillations PROM into shoulder fleixon/abduction IR/ER to end range with small oscillations at end range x 10 reps each  Therex:  Supine shoulder AAROM into flexion with hands clasped x 10 Table slides flexion/abd x 5 each  Ranger in sitting into shoulder flexion and circles x 5 each scap retractions x 10 Pt needs mod tactile and verbal cues to reduce shoulder shrug.     ROM: Shoulder flexion 95 deg, abduction 95 deg, ER 15 deg, IR 20 deg                         PT Education - 07/26/15 1222    Education provided Yes   Education Details RTC protocol- progression - active assested only. Wean off sling    Person(s) Educated Patient   Methods Explanation   Comprehension Verbalized understanding             PT Long Term Goals - 07/26/15 1225    PT LONG TERM GOAL #1   Title Patient will decrease Quick DASH score by > 8 points demonstrating reduced self-reported upper extremity disability    Time 12   Period Weeks   Status On-going   PT LONG TERM GOAL #2   Title Patient will be independent with home exercise program for prevention/ self-management of shoulder symptoms/ difficulty    Time 12   Period Weeks   Status Partially Met   PT LONG TERM GOAL #3   Title Pt to report 0/10 resting pain, 1-2/10 pain with self-stretching and strengthening program     Status On-going   PT LONG TERM GOAL #4   Title pt will reach into over head cabinet to retrieve 2lb itm x  5 with minimal shoulder compensation and pain <3/10   Time 12   Period Weeks   Status New   PT LONG TERM GOAL #5   Title pt will achieve flexion PROM to 140 deg, Abd to 150 deg, ER to 45 deg demonstrating functional ROM progress.    Time 4   Period Weeks   Status New               Plan - 07/26/15 1223    Clinical Impression Statement pt tolarated progression of RTC repair protocol to included AAROM well today with minimal increase in pain. pt shoulder  ROM struggles to improve, however. pt demonstrates limited PROM into flexion/abduction due to pain, ER has more capsular end feel. progressed HEP to include AAROM today. pt advised to wean off sling over the next week. pt arrived to apt 15 min late, session extended as courtesy.    Pt will benefit from skilled therapeutic intervention in order to improve on the following deficits Decreased activity  tolerance;Decreased range of motion;Decreased strength;Impaired flexibility;Impaired UE functional use;Pain   Rehab Potential Good   PT Frequency 2x / week   PT Duration 12 weeks   PT Treatment/Interventions Ultrasound;Cryotherapy;Electrical Stimulation;Moist Heat;Therapeutic exercise;Therapeutic activities;Passive range of motion;Dry needling;Manual techniques;Patient/family education;Aquatic Therapy   PT Next Visit Plan continue AAROM         Problem List Patient Active Problem List   Diagnosis Date Noted  . Routine general medical examination at a health care facility 02/22/2014  . Encounter for routine gynecological examination 02/22/2014   Gorden Harms. Lisseth Brazeau, PT, DPT [phone]  Darroll Bredeson 07/26/2015, 12:26 PM  [place] MAIN [facility] SERVICES [address] [place], [place], [address] Phone: [phone]   Fax:  [phone]

## 2015-07-30 ENCOUNTER — Ambulatory Visit: Payer: 59

## 2015-07-31 ENCOUNTER — Ambulatory Visit: Payer: 59

## 2015-07-31 DIAGNOSIS — R29898 Other symptoms and signs involving the musculoskeletal system: Secondary | ICD-10-CM

## 2015-07-31 DIAGNOSIS — M2560 Stiffness of unspecified joint, not elsewhere classified: Secondary | ICD-10-CM

## 2015-07-31 DIAGNOSIS — M6281 Muscle weakness (generalized): Secondary | ICD-10-CM

## 2015-08-01 NOTE — Therapy (Signed)
[place] MAIN [facility] SERVICES [address] [place], [place], [address] Phone: [phone]   Fax:  [phone]  Physical Therapy Treatment  Patient Details  Name: Amy Sims MRN: [id number] Date of Birth: 14-Dec-1956 Referring Provider:  Otila Back, MD  Encounter Date: 07/31/2015      PT End of Session - 08/01/15 1041    Visit Number 8   Number of Visits 25   Date for PT Re-Evaluation 08/14/15   PT Start Time 1730   PT Stop Time 1815   PT Time Calculation (min) 45 min   Equipment Utilized During Treatment --  sling   Activity Tolerance Patient tolerated treatment well   Behavior During Therapy [facility] for tasks assessed/performed      Past Medical History  Diagnosis Date  . Generalized headaches   . 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 Tooth extraction      There were no vitals filed for this visit.  Visit Diagnosis:  Weakness of shoulder  Stiffness in joint  Muscle weakness      Subjective Assessment - 08/01/15 1037    Subjective pt reports she went for follow up with surgeon. reports he agrees with PT that pts ROM is a little less than ideal especially in ER. pt reports her pain is ok-she takes pain medication before therapy. she reports she is compliant with HEP and has discontinued the sling use.    Patient Stated Goals full use of arm   Currently in Pain? Yes   Pain Score 3    Pain Location --  R shoulder   Pain Orientation Right   Pain Descriptors / Indicators Aching   Pain Onset 1 to 4 weeks ago      manual therapy: Grade II-III GH joint mobilizations AP and inferior glides 3x15 oscillations Mobilization with movement into shoulder ER passive and AAROM with posterior glide of the humerus. Shoulder in 20deg scaption.  PROM into shoulder fleixon/abduction IR/ER to end range with small oscillations at end range x 10 reps each Soft tissue  massage to the middle deltoid: efflurage and muscle stripping Supine shoulder ER/IR AAROM x 5 each Therex:  Supine shoulder AAROM into flexion wand x 10 Ranger in sitting into shoulder flexion/abduction x 10 ABCs with ranger (on floor) A-Z scap retractions x 10 Pt needs mod tactile and verbal cues to reduce shoulder shrug.    ROM: Shoulder flexion 95 deg, abduction 95 deg, ER 30 deg, IR 20 deg                                  PT Education - 08/01/15 1041    Education provided Yes   Education Details self massage to R deltoid at home   Person(s) Educated Patient   Methods Explanation   Comprehension Verbalized understanding             PT Long Term Goals - 07/26/15 1225    PT LONG TERM GOAL #1   Title Patient will decrease Quick DASH score by > 8 points demonstrating reduced self-reported upper extremity disability    Time 12   Period Weeks   Status On-going   PT LONG TERM GOAL #2   Title Patient will be independent with home exercise program for prevention/ self-management of shoulder symptoms/ difficulty  Time 12   Period Weeks   Status Partially Met   PT LONG TERM GOAL #3   Title Pt to report 0/10 resting pain, 1-2/10 pain with self-stretching and strengthening program     Status On-going   PT LONG TERM GOAL #4   Title pt will reach into over head cabinet to retrieve 2lb itm x 5 with minimal shoulder compensation and pain <3/10   Time 12   Period Weeks   Status New   PT LONG TERM GOAL #5   Title pt will achieve flexion PROM to 140 deg, Abd to 150 deg, ER to 45 deg demonstrating functional ROM progress.    Time 4   Period Weeks   Status New               Plan - 08/01/15 1044    Clinical Impression Statement pt continues to struggle with pain control during passive ROM. she asks PT to stop her flexion/abd ROM at approx 90 deg consistently. pt did demo improvement with shoulder ER following manual therapy and mobilization  with movement. pt does have a trigger point in her mid deltoid that does seem especially painful during ROM. she had reduced pain following soft tissue massage to the area. will re-evaluate this area for trigger point dry needling next session in attempt to improve pts tolerance to ROM.    Pt will benefit from skilled therapeutic intervention in order to improve on the following deficits Decreased activity tolerance;Decreased range of motion;Decreased strength;Impaired flexibility;Impaired UE functional use;Pain   Rehab Potential Good   PT Frequency 2x / week   PT Duration 12 weeks   PT Treatment/Interventions Ultrasound;Cryotherapy;Electrical Stimulation;Moist Heat;Therapeutic exercise;Therapeutic activities;Passive range of motion;Dry needling;Manual techniques;Patient/family education;Aquatic Therapy   PT Next Visit Plan continue AAROM         Problem List Patient Active Problem List   Diagnosis Date Noted  . Routine general medical examination at a health care facility 02/22/2014  . Encounter for routine gynecological examination 02/22/2014   Gorden Harms. Aaralyn Kil, PT, DPT [phone]  Olliver Boyadjian 08/01/2015, 10:46 AM  [place] MAIN [facility] SERVICES [address] [place], [place], [address] Phone: [phone]   Fax:  [phone]

## 2015-08-02 ENCOUNTER — Ambulatory Visit: Payer: 59

## 2015-08-02 DIAGNOSIS — M2560 Stiffness of unspecified joint, not elsewhere classified: Secondary | ICD-10-CM

## 2015-08-02 DIAGNOSIS — M6281 Muscle weakness (generalized): Secondary | ICD-10-CM

## 2015-08-02 DIAGNOSIS — R29898 Other symptoms and signs involving the musculoskeletal system: Secondary | ICD-10-CM

## 2015-08-02 NOTE — Therapy (Signed)
[place] MAIN [facility] SERVICES [address] [place], [place], [address] Phone: [phone]   Fax:  [phone]  Physical Therapy Treatment  Patient Details  Name: Rohini Jaroszewski MRN: [id number] Date of Birth: 26-Mar-1956 Referring Provider:  Otila Back, MD  Encounter Date: 08/02/2015      PT End of Session - 08/02/15 1814    Visit Number 9   Number of Visits 25   Date for PT Re-Evaluation 08/14/15   PT Start Time 2725   PT Stop Time 1810   PT Time Calculation (min) 40 min   Equipment Utilized During Treatment --  sling   Activity Tolerance Patient tolerated treatment well   Behavior During Therapy [facility] for tasks assessed/performed      Past Medical History  Diagnosis Date  . Generalized headaches   . 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 Tooth extraction      There were no vitals filed for this visit.  Visit Diagnosis:  Weakness of shoulder  Stiffness in joint  Muscle weakness      Subjective Assessment - 08/02/15 1813    Subjective pt reports she is trying to stretch her arm at home. she reports no resting pain. pt reports she is compliant with HEP and restrictions.    Patient Stated Goals full use of arm   Currently in Pain? No/denies   Pain Onset 1 to 4 weeks ago        manual therapy: Grade II-III GH joint mobilizations AP and inferior glides 3x15 oscillations Mobilization with movement into shoulder ER passive and AAROM with posterior glide of the humerus. Shoulder in 20deg scaption.  Mobilization with movement into shoulder flexion with inferior glide of humerus ( AAROM) x 10 PROM into shoulder fleixon/abduction IR/ER to end range with small oscillations at end range x 10 reps each  Therex:  ABCs with ranger (on floor) A-Z x 2 scap retractions x 10 Ranger (standing ) flexion AAROM x 10 (assist as needed)  Pt needs mod tactile and verbal  cues to reduce shoulder shrug.                             PT Education - 08/01/15 1041    Education provided Yes   Education Details self massage to R deltoid at home   Person(s) Educated Patient   Methods Explanation   Comprehension Verbalized understanding             PT Long Term Goals - 07/26/15 1225    PT LONG TERM GOAL #1   Title Patient will decrease Quick DASH score by > 8 points demonstrating reduced self-reported upper extremity disability    Time 12   Period Weeks   Status On-going   PT LONG TERM GOAL #2   Title Patient will be independent with home exercise program for prevention/ self-management of shoulder symptoms/ difficulty    Time 12   Period Weeks   Status Partially Met   PT LONG TERM GOAL #3   Title Pt to report 0/10 resting pain, 1-2/10 pain with self-stretching and strengthening program     Status On-going   PT LONG TERM GOAL #4   Title pt will reach into over head cabinet to retrieve 2lb itm x 5 with minimal shoulder compensation and pain <3/10   Time 12  Period Weeks   Status New   PT LONG TERM GOAL #5   Title pt will achieve flexion PROM to 140 deg, Abd to 150 deg, ER to 45 deg demonstrating functional ROM progress.    Time 4   Period Weeks   Status New               Plan - 08/02/15 1815    Clinical Impression Statement pt still guarded during PROM limiting motion. pt did better with mobilization with movement. demonstrated 100deg flexion today and 30 deg ER s/p manual therapy. progressed AAROM to against gravity using the ranger. pt able to do this but requires assist with eccentric lowering and max cues for reducing shoulder shrug.    Pt will benefit from skilled therapeutic intervention in order to improve on the following deficits Decreased activity tolerance;Decreased range of motion;Decreased strength;Impaired flexibility;Impaired UE functional use;Pain   Rehab Potential Good   PT Frequency 2x / week   PT  Duration 12 weeks   PT Treatment/Interventions Ultrasound;Cryotherapy;Electrical Stimulation;Moist Heat;Therapeutic exercise;Therapeutic activities;Passive range of motion;Dry needling;Manual techniques;Patient/family education;Aquatic Therapy   PT Next Visit Plan continue AAROM         Problem List Patient Active Problem List   Diagnosis Date Noted  . Routine general medical examination at a health care facility 02/22/2014  . Encounter for routine gynecological examination 02/22/2014   Gorden Harms. Markeis Allman, PT, DPT [phone]  Holli Rengel 08/02/2015, 6:19 PM  [place] MAIN [facility] SERVICES [address] [place], [place], [address] Phone: [phone]   Fax:  [phone]

## 2015-08-06 ENCOUNTER — Ambulatory Visit: Payer: 59 | Admitting: Physical Therapy

## 2015-08-06 ENCOUNTER — Encounter: Payer: Self-pay | Admitting: Physical Therapy

## 2015-08-06 DIAGNOSIS — M6281 Muscle weakness (generalized): Secondary | ICD-10-CM

## 2015-08-06 DIAGNOSIS — R29898 Other symptoms and signs involving the musculoskeletal system: Secondary | ICD-10-CM

## 2015-08-06 DIAGNOSIS — M2560 Stiffness of unspecified joint, not elsewhere classified: Secondary | ICD-10-CM

## 2015-08-06 NOTE — Therapy (Signed)
[place] MAIN [facility] SERVICES [address] [place], [place], [address] Phone: [phone]   Fax:  [phone]  Physical Therapy Treatment  Patient Details  Name: Gloristine Turrubiates MRN: [id number] Date of Birth: May 14, 1956 Referring Provider:  Otila Back, MD  Encounter Date: 08/06/2015      PT End of Session - 08/06/15 1722    Visit Number 10   Number of Visits 25   Date for PT Re-Evaluation 08/14/15   PT Start Time 1324   PT Stop Time 1805   PT Time Calculation (min) 40 min   Equipment Utilized During Treatment --  sling   Activity Tolerance Patient tolerated treatment well   Behavior During Therapy [facility] for tasks assessed/performed      Past Medical History  Diagnosis Date  . Generalized headaches   . 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 Tooth extraction      There were no vitals filed for this visit.  Visit Diagnosis:  Weakness of shoulder  Stiffness in joint  Muscle weakness      Subjective Assessment - 08/06/15 1723    Subjective pt reports she is trying to stretch her arm at home. she reports no resting pain. pt reports she is compliant with HEP and restrictions. She has no pain if she doesnt move it but it is a 2/10 if she moves it.    Patient is accompained by: Family member   Patient Stated Goals full use of arm   Pain Score 2    Pain Location Shoulder   Pain Orientation Right   Pain Onset 1 to 4 weeks ago          Therex:  Standing with theraball on table and shoulder flex, shoulder abd/add 90 deg , AAROM ABCs with ranger 1/2 way up on the wall with assist for horizontal add/abd  A-Z x 2 Supine shoulder protraction with rod x 10 x 2, AAROM Supine shoulder 90 deg and horizontal abd/add x 10 x 2, AAROM with rod  scap retractions x 10 Ranger (standing ) flexion AAROM x 10 (assist as needed)  Pt needs mod tactile and verbal cues to reduce  shoulder shrug                          PT Education - 08/06/15 1722    Education provided Yes   Education Details HEP   Person(s) Educated Patient   Methods Explanation   Comprehension Verbalized understanding             PT Long Term Goals - 07/26/15 1225    PT LONG TERM GOAL #1   Title Patient will decrease Quick DASH score by > 8 points demonstrating reduced self-reported upper extremity disability    Time 12   Period Weeks   Status On-going   PT LONG TERM GOAL #2   Title Patient will be independent with home exercise program for prevention/ self-management of shoulder symptoms/ difficulty    Time 12   Period Weeks   Status Partially Met   PT LONG TERM GOAL #3   Title Pt to report 0/10 resting pain, 1-2/10 pain with self-stretching and strengthening program     Status On-going   PT LONG TERM GOAL #4   Title pt will reach into over head cabinet to retrieve 2lb itm x 5 with minimal  shoulder compensation and pain <3/10   Time 12   Period Weeks   Status New   PT LONG TERM GOAL #5   Title pt will achieve flexion PROM to 140 deg, Abd to 150 deg, ER to 45 deg demonstrating functional ROM progress.    Time 4   Period Weeks   Status New               Plan - 08/06/15 1734    Clinical Impression Statement Patient is having pain with AAROM eccentric shoulder flex and abduction and is guarded about using her RUE with exercises. She has decreased strength and decreased ROM RUE and pain that is worse with AROM.    Pt will benefit from skilled therapeutic intervention in order to improve on the following deficits Decreased activity tolerance;Decreased range of motion;Decreased strength;Impaired flexibility;Impaired UE functional use;Pain   Rehab Potential Good   PT Frequency 2x / week   PT Duration 12 weeks   PT Treatment/Interventions Ultrasound;Cryotherapy;Electrical Stimulation;Moist Heat;Therapeutic exercise;Therapeutic activities;Passive range of  motion;Dry needling;Manual techniques;Patient/family education;Aquatic Therapy   PT Next Visit Plan continue AAROM         Problem List Patient Active Problem List   Diagnosis Date Noted  . Routine general medical examination at a health care facility 02/22/2014  . Encounter for routine gynecological examination 02/22/2014    Alanson Puls 08/06/2015, 6:18 PM  [place] MAIN [facility] SERVICES [address] [place], [place], [address] Phone: [phone]   Fax:  [phone]

## 2015-08-09 ENCOUNTER — Ambulatory Visit: Payer: 59

## 2015-08-09 DIAGNOSIS — M6281 Muscle weakness (generalized): Secondary | ICD-10-CM

## 2015-08-09 DIAGNOSIS — R29898 Other symptoms and signs involving the musculoskeletal system: Secondary | ICD-10-CM

## 2015-08-09 DIAGNOSIS — M2560 Stiffness of unspecified joint, not elsewhere classified: Secondary | ICD-10-CM

## 2015-08-09 NOTE — Therapy (Signed)
[place] MAIN [facility] SERVICES [address] [place], [place], [address] Phone: [phone]   Fax:  [phone]  Physical Therapy Treatment /Physical Therapy Progress Note 07/12/2015  to 08/09/2015   Patient Details  Name: Amy Sims MRN: [id number] Date of Birth: 11-12-1956 Referring Provider:  Otila Back, MD  Encounter Date: 08/09/2015      PT End of Session - 08/09/15 1605    Visit Number 11   Number of Visits 25   Date for PT Re-Evaluation 09/06/15   PT Start Time 1430   PT Stop Time 6834   PT Time Calculation (min) 44 min   Equipment Utilized During Treatment --  sling   Activity Tolerance Patient tolerated treatment well   Behavior During Therapy [facility] for tasks assessed/performed      Past Medical History  Diagnosis Date  . Generalized headaches   . 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 Tooth extraction      There were no vitals filed for this visit.  Visit Diagnosis:  Weakness of shoulder - Plan: PT plan of care cert/re-cert  Stiffness in joint - Plan: PT plan of care cert/re-cert  Muscle weakness - Plan: PT plan of care cert/re-cert      Subjective Assessment - 08/09/15 1601    Subjective pt reports working hard on her ROM at home. she reports she started driving today without increased pain   Patient is accompained by: Family member   Patient Stated Goals full use of arm   Currently in Pain? No/denies   Pain Onset 1 to 4 weeks ago      Manual therapy Grade II-III AP and inferior GH glides 3x20s bouts of oscillations at open packed position and at end range flexion PROM to R shoulder x 10 reps with gentle over pressure and oscillations at end range into flexion, scaption and ER AAROM: Shoulder flexion 105 deg, abduction 100 deg, ER 32 deg  Therex: Shoulder AAROM with wand x  10  Shoulder isometrics: extension, flexion, IR/ER adduction 5s x  10 each way in supine (20deg flexion in scapular plane) Ranger (flexion AAROM) with assist x  6  Prone shoulder extension with scap retraction 2x10 Supine rhythmic stabilizations (90deg) flexion ,k 100deg flexion and 90 deg scaption (gentle ) 20s x 2 each Pt requires mod verbal and tactile cues to reduce shoulder shrug                             PT Education - 08/09/15 1604    Education provided Yes   Education Details progression of exercises. recommended "thumb up" position for overhead flexion AAROM with wand   Person(s) Educated Patient   Methods Explanation   Comprehension Verbalized understanding             PT Long Term Goals - 08/09/15 1612    PT LONG TERM GOAL #1   Title Patient will decrease Quick DASH score by > 8 points demonstrating reduced self-reported upper extremity disability    Time 12   Period Weeks   Status On-going   PT LONG TERM GOAL #2   Title Patient will be independent with home exercise program for prevention/ self-management of shoulder symptoms/ difficulty    Time 12   Period Weeks   Status Partially Met   PT LONG TERM GOAL #3  Title Pt to report 0/10 resting pain, 1-2/10 pain with self-stretching and strengthening program     Status On-going   PT LONG TERM GOAL #4   Title pt will reach into over head cabinet to retrieve 2lb itm x 5 with minimal shoulder compensation and pain <3/10   Time 12   Period Weeks   Status Deferred   PT LONG TERM GOAL #5   Title pt will achieve flexion PROM to 140 deg, Abd to 150 deg, ER to 45 deg demonstrating functional ROM progress.    Time 4   Period Weeks   Status Partially Met               Plan - 08/09/15 1605    Clinical Impression Statement pt is progressing gradually towards goals. She is a little behind on her protocol due to ROM limitations. Did progress therex today including isometrics and AAROM. pt has more AAROM than she does  PROM in regard to flexion/abduction  suggesting pts guarding is limiting her progress of PROM. pt would benefit from continued skilled PT services to improve ROM, strength, reduce pain and maximize RUE function.    Pt will benefit from skilled therapeutic intervention in order to improve on the following deficits Decreased activity tolerance;Decreased range of motion;Decreased strength;Impaired flexibility;Impaired UE functional use;Pain   Rehab Potential Good   PT Frequency 2x / week   PT Duration 8 weeks   PT Treatment/Interventions Ultrasound;Cryotherapy;Electrical Stimulation;Moist Heat;Therapeutic exercise;Therapeutic activities;Passive range of motion;Dry needling;Manual techniques;Patient/family education;Aquatic Therapy   PT Next Visit Plan continue AAROM         Problem List Patient Active Problem List   Diagnosis Date Noted  . Routine general medical examination at a health care facility 02/22/2014  . Encounter for routine gynecological examination 02/22/2014   Gorden Harms. Avis Mcmahill, PT, DPT [phone]  Blaine Hari 08/09/2015, 4:15 PM   [facility] MAIN [facility] SERVICES [address] [place], [place], [address] Phone: [phone]   Fax:  [phone]

## 2015-08-14 ENCOUNTER — Ambulatory Visit: Payer: 59

## 2015-08-14 DIAGNOSIS — R29898 Other symptoms and signs involving the musculoskeletal system: Secondary | ICD-10-CM

## 2015-08-14 DIAGNOSIS — M2560 Stiffness of unspecified joint, not elsewhere classified: Secondary | ICD-10-CM

## 2015-08-15 NOTE — Therapy (Signed)
[place] MAIN [facility] SERVICES [address] [place], [place], [address] Phone: [phone]   Fax:  [phone]  Physical Therapy Treatment  Patient Details  Name: Amy Sims MRN: [id number] Date of Birth: 06-30-1956 Referring Provider:  Otila Back, MD  Encounter Date: 08/14/2015      PT End of Session - 08/15/15 1101    Visit Number 12   Number of Visits 25   Date for PT Re-Evaluation 09/06/15   PT Start Time 9622   PT Stop Time 1630   PT Time Calculation (min) 45 min   Equipment Utilized During Treatment --  sling   Activity Tolerance Patient tolerated treatment well;Patient limited by pain  pain at end range   Behavior During Therapy [facility] for tasks assessed/performed      Past Medical History  Diagnosis Date  . Generalized headaches   . 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 Tooth extraction      There were no vitals filed for this visit.  Visit Diagnosis:  Weakness of shoulder  Stiffness in joint      Subjective Assessment - 08/15/15 1059    Subjective Denies shoulder pain upon arrival. Pt reports consistency with HEP. She has no specific questions or concerns initially upon arrival.    Patient is accompained by: Family member   Patient Stated Goals full use of arm   Currently in Pain? No/denies      Manual therapy PROM to R shoulder x 10 reps with gentle over pressure and oscillations at end range into flexion, abduction, IR, and ER; Gentle GH distraction at 0, 30, and 60 degrees of abduction; Grade II-III AP and inferior GH glides 3x20s bouts of oscillations at open packed position and at end range flexion; Attempted grade II medial to lateral glides at 90 flexion but pt reports tenderness do discontinued; STM to R anterior, lateral, and posterior deltoid to reduce muscle guarding; AAROM canes for flexion x 15;  There-ex: Shoulder  isometrics: extension, flexion, abduction, IR and ER adduction 10 second hold x 5 in supine (20deg flexion in scapular plane) Prone shoulder extension with scap retraction x 10; Prone mid-trap rows x 10, limited due to poor shoulder flexion in prone; Supine rhythmic stabilizations (90deg) flexion, gentle resistance proximal to elbow; Education and demonstration regarding standing doorway ER stretch;                           PT Education - 08/15/15 1100    Education provided Yes   Education Details Reinforced HEP, importance of periscapular strengthening, standing doorway stretch for ER (pain-free)   Person(s) Educated Patient   Methods Explanation;Demonstration   Comprehension Verbalized understanding;Returned demonstration             PT Long Term Goals - 08/09/15 1612    PT LONG TERM GOAL #1   Title Patient will decrease Quick DASH score by > 8 points demonstrating reduced self-reported upper extremity disability    Time 12   Period Weeks   Status On-going   PT LONG TERM GOAL #2   Title Patient will be independent with home exercise program for prevention/ self-management of shoulder symptoms/ difficulty    Time 12   Period Weeks   Status Partially Met   PT LONG TERM GOAL #3   Title Pt to report 0/10 resting pain,  1-2/10 pain with self-stretching and strengthening program     Status On-going   PT LONG TERM GOAL #4   Title pt will reach into over head cabinet to retrieve 2lb itm x 5 with minimal shoulder compensation and pain <3/10   Time 12   Period Weeks   Status Deferred   PT LONG TERM GOAL #5   Title pt will achieve flexion PROM to 140 deg, Abd to 150 deg, ER to 45 deg demonstrating functional ROM progress.    Time 4   Period Weeks   Status Partially Met               Plan - 08/15/15 1101    Clinical Impression Statement Pt demonstrates slow progress toward ROM goals. She achieves slight improvement in R shoulder flexion and ER by  the end of session with increased relaxation and decreased muscle guarding. She appears somewhat anxious about her progress during session asking questions throughout regarding prognosis and liklihood of full recovery. Demonstrated doorway stretch for R shoulder ER if patient would like to incoporate into HEP.     Pt will benefit from skilled therapeutic intervention in order to improve on the following deficits Decreased activity tolerance;Decreased range of motion;Decreased strength;Impaired flexibility;Impaired UE functional use;Pain   Rehab Potential Good   PT Frequency 2x / week   PT Duration 8 weeks   PT Treatment/Interventions Ultrasound;Cryotherapy;Electrical Stimulation;Moist Heat;Therapeutic exercise;Therapeutic activities;Passive range of motion;Dry needling;Manual techniques;Patient/family education;Aquatic Therapy   PT Next Visit Plan continue AAROM, isometrics, periscapular stregthening.         Problem List Patient Active Problem List   Diagnosis Date Noted  . Routine general medical examination at a health care facility 02/22/2014  . Encounter for routine gynecological examination 02/22/2014   Phillips Grout PT, DPT   Cedar Ditullio 08/15/2015, 11:07 AM  [place] MAIN [facility] SERVICES [address] [place], [place], [address] Phone: [phone]   Fax:  [phone]

## 2015-08-16 ENCOUNTER — Ambulatory Visit: Payer: 59 | Attending: Orthopaedic Surgery

## 2015-08-16 DIAGNOSIS — M2560 Stiffness of unspecified joint, not elsewhere classified: Secondary | ICD-10-CM | POA: Diagnosis present

## 2015-08-16 DIAGNOSIS — R29898 Other symptoms and signs involving the musculoskeletal system: Secondary | ICD-10-CM | POA: Insufficient documentation

## 2015-08-16 DIAGNOSIS — M6281 Muscle weakness (generalized): Secondary | ICD-10-CM | POA: Insufficient documentation

## 2015-08-17 NOTE — Therapy (Signed)
[place] MAIN [facility] SERVICES [address] [place], [place], [address] Phone: [phone]   Fax:  [phone]  Physical Therapy Treatment  Patient Details  Name: Amy Sims MRN: [id number] Date of Birth: [birth year] Referring Provider:  Otila Back, MD  Encounter Date: 08/16/2015      PT End of Session - 08/17/15 1145    Visit Number 13   Number of Visits 25   Date for PT Re-Evaluation 09/06/15   PT Start Time 4403   PT Stop Time 1735   PT Time Calculation (min) 40 min   Equipment Utilized During Treatment --  sling   Activity Tolerance Patient tolerated treatment well;Patient limited by pain  pain at end range   Behavior During Therapy [facility] for tasks assessed/performed      Past Medical History  Diagnosis Date  . Generalized headaches   . 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 Tooth extraction      There were no vitals filed for this visit.  Visit Diagnosis:  Weakness of shoulder  Stiffness in joint  Muscle weakness      Subjective Assessment - 08/17/15 1144    Subjective Pt denies shoulder pain at rest upon arrival. She has no specific questions or concerns at this time.   Patient is accompained by: Family member   Patient Stated Goals full use of arm   Currently in Pain? No/denies        Manual therapy PROM to R shoulder x 10 reps with gentle over pressure and oscillations at end range into flexion, scaption, and ER; Gentle GH distraction at 0, 30, and 60 degrees of scaption; Grade II-III AP and inferior GH glides 3x20s bouts of oscillations at open packed position and at end range flexion; AAROM canes for flexion x 15;  There-ex: Shoulder isometrics: extension, flexion, abduction, IR and ER adduction 10 second hold x 5 in supine (20deg flexion in scapular plane) Prone shoulder extension with scap retraction x 10; Supine rhythmic  stabilizations (90deg) flexion, gentle resistance proximal to elbow; Cues provided for proper form during all exercises. Encouraged submaximal contraction with abduction as pt reports some mild pain with initial repetition. Pain abolishes with cues.                                                PT Education - 08/17/15 1145    Education provided Yes   Education Details Reinforced HEP   Person(s) Educated Patient   Methods Explanation   Comprehension Verbalized understanding             PT Long Term Goals - 08/09/15 1612    PT LONG TERM GOAL #1   Title Patient will decrease Quick DASH score by > 8 points demonstrating reduced self-reported upper extremity disability    Time 12   Period Weeks   Status On-going   PT LONG TERM GOAL #2   Title Patient will be independent with home exercise program for prevention/ self-management of shoulder symptoms/ difficulty    Time 12   Period Weeks   Status Partially Met   PT LONG TERM GOAL #3   Title Pt to report 0/10 resting pain, 1-2/10 pain with self-stretching and strengthening program     Status  On-going   PT LONG TERM GOAL #4   Title pt will reach into over head cabinet to retrieve 2lb itm x 5 with minimal shoulder compensation and pain <3/10   Time 12   Period Weeks   Status Deferred   PT LONG TERM GOAL #5   Title pt will achieve flexion PROM to 140 deg, Abd to 150 deg, ER to 45 deg demonstrating functional ROM progress.    Time 4   Period Weeks   Status Partially Met               Plan - 08/17/15 1146    Clinical Impression Statement Pt demonstrates increased R shoulder flexion AAROM compared to PROM. ROM gradually improves following manual therapy. Pt reports pain at end range flexion, scaption, and ER during session. Pt demonstrates some anxiety and guarding regarding R shoulder ROM progression. Encouraged pt to continue HEP and follow-up as scheduled.    Pt will benefit from  skilled therapeutic intervention in order to improve on the following deficits Decreased activity tolerance;Decreased range of motion;Decreased strength;Impaired flexibility;Impaired UE functional use;Pain   Rehab Potential Good   PT Frequency 2x / week   PT Duration 8 weeks   PT Treatment/Interventions Ultrasound;Cryotherapy;Electrical Stimulation;Moist Heat;Therapeutic exercise;Therapeutic activities;Passive range of motion;Dry needling;Manual techniques;Patient/family education;Aquatic Therapy   PT Next Visit Plan continue AAROM, isometrics, periscapular stregthening.         Problem List Patient Active Problem List   Diagnosis Date Noted  . Routine general medical examination at a health care facility 02/22/2014  . Encounter for routine gynecological examination 02/22/2014   Phillips Grout PT, DPT   Huprich,Jason 08/17/2015, 11:50 AM  [place] MAIN [facility] SERVICES [address] [place], [place], [address] Phone: [phone]   Fax:  [phone]

## 2015-08-21 ENCOUNTER — Ambulatory Visit: Payer: 59

## 2015-08-21 DIAGNOSIS — M2560 Stiffness of unspecified joint, not elsewhere classified: Secondary | ICD-10-CM

## 2015-08-21 DIAGNOSIS — M6281 Muscle weakness (generalized): Secondary | ICD-10-CM

## 2015-08-21 DIAGNOSIS — R29898 Other symptoms and signs involving the musculoskeletal system: Secondary | ICD-10-CM | POA: Diagnosis not present

## 2015-08-22 NOTE — Therapy (Signed)
[place] MAIN [facility] SERVICES [address] [place], [place], [address] Phone: [phone]   Fax:  [phone]  Physical Therapy Treatment  Patient Details  Name: Amy Sims MRN: [id number] Date of Birth: 05-01-1956 Referring Provider:  Otila Back, MD  Encounter Date: 08/21/2015      PT End of Session - 08/22/15 1341    Visit Number 14   Number of Visits 25   Date for PT Re-Evaluation 09/06/15   PT Start Time 1645   PT Stop Time 1730   PT Time Calculation (min) 45 min   Equipment Utilized During Treatment --  sling   Activity Tolerance Patient tolerated treatment well;Patient limited by pain  pain at end range   Behavior During Therapy [facility] for tasks assessed/performed      Past Medical History  Diagnosis Date  . Generalized headaches   . 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 Tooth extraction      There were no vitals filed for this visit.  Visit Diagnosis:  Weakness of shoulder  Stiffness in joint  Muscle weakness      Subjective Assessment - 08/22/15 1338    Subjective pt reports no shoulder pain at rest. pt reports she still uses the other arm to place her RUE on objects at home.    Patient is accompained by: Family member   Patient Stated Goals full use of arm   Currently in Pain? No/denies   Pain Score 0-No pain   Pain Onset 1 to 4 weeks ago      Supine flexion AROM x 10 Serratus punch 2x10 Prone extension 1lb 2x10 Prone row 1lb x 10 Prone horiz abduction AAROM 2x5 sidelying shoulder flexion to 90 deg 2x5 sidelying abduction to 90 x 10 with 5s hold sidelying ER AAROM 2x10  pullies into flexion/scaption x 10 each  Wall slide x 5 (flexion) Pt requires mod/max cues verbal and tactile for most exercises. She demonstrates poor scapular control and body awareness, in addition to significant GH muscle weakness.    Manual  therapy: Mobilization with movement into ER at 30 and 45 deg scaption 2x10 each with posterior / medial glide of humerus Soft tissue massage to the deltoid Grade II-III AP and inferior GH glide 2 bouts of 30                            PT Education - 08/22/15 1340    Education provided Yes   Education Details avoid guarding arm at side. try to use arm more for ADLs.    Person(s) Educated Patient   Methods Explanation   Comprehension Verbalized understanding             PT Long Term Goals - 08/09/15 1612    PT LONG TERM GOAL #1   Title Patient will decrease Quick DASH score by > 8 points demonstrating reduced self-reported upper extremity disability    Time 12   Period Weeks   Status On-going   PT LONG TERM GOAL #2   Title Patient will be independent with home exercise program for prevention/ self-management of shoulder symptoms/ difficulty    Time 12   Period Weeks   Status Partially Met   PT LONG TERM GOAL #3   Title Pt to report 0/10 resting pain, 1-2/10 pain with self-stretching and strengthening program  Status On-going   PT LONG TERM GOAL #4   Title pt will reach into over head cabinet to retrieve 2lb itm x 5 with minimal shoulder compensation and pain <3/10   Time 12   Period Weeks   Status Deferred   PT LONG TERM GOAL #5   Title pt will achieve flexion PROM to 140 deg, Abd to 150 deg, ER to 45 deg demonstrating functional ROM progress.    Time 4   Period Weeks   Status Partially Met               Plan - 08/22/15 1341    Clinical Impression Statement pt demonstrates anxiety regarding pushing through gentle stretching or mild pain to improve her ROM. pt shows increased compensatory patterns of the shoulder when resting and reports during ADLs. extensive education regarding returning her arm to normal use today (still avoid lifting) but increasing the use of her arm for ADLs or simple reaching tasks as disuse with limit her progress  further. pt also demonstrates compensatory patterns during AAROM / therex needing mod -max verbal and tactile cues. pt also needs assist for her arm close by as she is still anxious that it will fall and tends to reach for the arm with the uninvolved side to support it during AAROM exercises. progressed therex and HEP today to further progress her ROM and strength. pt would benefit form further PT services to improve ROM, strength and improve functon. pt did have improved AAROM today, still guarded with PROM despite cues. PT therefore focused on therex as she has more ROM during these exercises.    Pt will benefit from skilled therapeutic intervention in order to improve on the following deficits Decreased activity tolerance;Decreased range of motion;Decreased strength;Impaired flexibility;Impaired UE functional use;Pain   Rehab Potential Good   PT Frequency 2x / week   PT Duration 8 weeks   PT Treatment/Interventions Ultrasound;Cryotherapy;Electrical Stimulation;Moist Heat;Therapeutic exercise;Therapeutic activities;Passive range of motion;Dry needling;Manual techniques;Patient/family education;Aquatic Therapy   PT Next Visit Plan continue AAROM, isometrics, periscapular stregthening.         Problem List Patient Active Problem List   Diagnosis Date Noted  . Routine general medical examination at a health care facility 02/22/2014  . Encounter for routine gynecological examination 02/22/2014   Gorden Harms. Jeneen Doutt, PT, DPT [id number]   Dao Mearns 08/22/2015, 1:46 PM  [facility] MAIN [facility] SERVICES [address] [place], [place], [address] Phone: [phone]   Fax:  [phone]

## 2015-08-22 NOTE — Patient Instructions (Signed)
[email or web address] Pt was issued shoulder flexion AROM in supine, serratus punch, sidelying shoulder flexion, prone row, pullies, wall slides (as performed in therex today)

## 2015-08-23 ENCOUNTER — Ambulatory Visit: Payer: 59

## 2015-08-23 DIAGNOSIS — R29898 Other symptoms and signs involving the musculoskeletal system: Secondary | ICD-10-CM

## 2015-08-23 DIAGNOSIS — M6281 Muscle weakness (generalized): Secondary | ICD-10-CM

## 2015-08-23 DIAGNOSIS — M2560 Stiffness of unspecified joint, not elsewhere classified: Secondary | ICD-10-CM

## 2015-08-23 NOTE — Therapy (Signed)
[place] MAIN [facility] SERVICES [address] [place], [place], [address] Phone: [phone]   Fax:  [phone]  Physical Therapy Treatment  Patient Details  Name: Amy Sims MRN: [id number] Date of Birth: 10/26/1956 Referring Provider:  Otila Back, MD  Encounter Date: 08/23/2015      PT End of Session - 08/23/15 1748    Visit Number 15   Number of Visits 25   Date for PT Re-Evaluation 09/06/15   PT Start Time 1645   PT Stop Time 1730   PT Time Calculation (min) 45 min   Equipment Utilized During Treatment --  sling   Activity Tolerance Patient tolerated treatment well;Patient limited by pain  pain at end range   Behavior During Therapy [facility] for tasks assessed/performed      Past Medical History  Diagnosis Date  . Generalized headaches   . 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 Tooth extraction      There were no vitals filed for this visit.  Visit Diagnosis:  Weakness of shoulder  Stiffness in joint  Muscle weakness      Subjective Assessment - 08/23/15 1740    Subjective pt reports she was sore after progression of therex last visit. she reports she is compliant with her HEP   Patient is accompained by: Family member   Patient Stated Goals full use of arm   Currently in Pain? Yes   Pain Score 2    Pain Location --  R shoulder (lateral and posterior)   Pain Descriptors / Indicators Aching   Pain Onset 1 to 4 weeks ago      Manual therapy:  Thoracic CPA glides grade II-III T2-10 2 bouts x 20s each level Inferior, lateral, and AP GH joint mobs grade II-III 3 bouts of 20 s each and and end range flexion  Mobilization with movement into ER with posterior inferior glide of GH joint grade III 3x10 PROM shoulder flexion/ ER / abduction x 5 each with gentle over pressure at end range to pts tolerance.  Ischemic trigger point release to teres major,  subscapularis, infraspinatus at noted  TPs Soft tissue massage to infraspinatus, supraspinatus, teres minor/major   therex UBE L 0 x 2 min no charge warm up Shoulder flexion in supine with gentle manual resist 2x5 Rhythmic stabilization at 100deg flexion and 60 deg flexion 20-30s x 3  Supine shoulder circles at 90 deg x 20 CW/CCW pec stretch in doorway 30s x 2 Finger ladder x 10 flexion, x 3 abduction  Pt requires mod cues for proper exercise performance                           PT Education - 08/23/15 1748    Education provided Yes   Education Details self triggerpoint release with tennis ball    Person(s) Educated Patient   Methods Explanation;Demonstration   Comprehension Verbalized understanding             PT Long Term Goals - 08/09/15 1612    PT LONG TERM GOAL #1   Title Patient will decrease Quick DASH score by > 8 points demonstrating reduced self-reported upper extremity disability    Time 12   Period Weeks   Status On-going   PT LONG TERM GOAL #2   Title Patient will be independent with home exercise program  for prevention/ self-management of shoulder symptoms/ difficulty    Time 12   Period Weeks   Status Partially Met   PT LONG TERM GOAL #3   Title Pt to report 0/10 resting pain, 1-2/10 pain with self-stretching and strengthening program     Status On-going   PT LONG TERM GOAL #4   Title pt will reach into over head cabinet to retrieve 2lb itm x 5 with minimal shoulder compensation and pain <3/10   Time 12   Period Weeks   Status Deferred   PT LONG TERM GOAL #5   Title pt will achieve flexion PROM to 140 deg, Abd to 150 deg, ER to 45 deg demonstrating functional ROM progress.    Time 4   Period Weeks   Status Partially Met               Plan - 08/23/15 1748    Clinical Impression Statement pt reported posterior arm pain when trying to perform her therex and / PROM. upon posterior palpation pt has multiple trigger points  in the infraspinatus, teres minor/major, lat and subscap border. pt is also slightly hypomobile and tender along mid thoracic spine. pt did respond to trigger point release with less pain during end range flexion following manual therapy. pt still shows substitutions and poor GH and scap/throacic mechanics in gravity AAROM positioning so will continue with supine AROM for most exercises at this time.    Pt will benefit from skilled therapeutic intervention in order to improve on the following deficits Decreased activity tolerance;Decreased range of motion;Decreased strength;Impaired flexibility;Impaired UE functional use;Pain   Rehab Potential Good   PT Frequency 2x / week   PT Duration 8 weeks   PT Treatment/Interventions Ultrasound;Cryotherapy;Electrical Stimulation;Moist Heat;Therapeutic exercise;Therapeutic activities;Passive range of motion;Dry needling;Manual techniques;Patient/family education;Aquatic Therapy   PT Next Visit Plan continue AAROM, isometrics, periscapular stregthening.         Problem List Patient Active Problem List   Diagnosis Date Noted  . Routine general medical examination at a health care facility 02/22/2014  . Encounter for routine gynecological examination 02/22/2014   Gorden Harms. Lateia Fraser, PT, DPT [phone]  Amy Sims 08/23/2015, 5:52 PM  [place] MAIN [facility] SERVICES [address] [place], [place], [address] Phone: [phone]   Fax:  [phone]

## 2015-08-28 ENCOUNTER — Ambulatory Visit: Payer: 59

## 2015-08-28 DIAGNOSIS — R29898 Other symptoms and signs involving the musculoskeletal system: Secondary | ICD-10-CM

## 2015-08-28 DIAGNOSIS — M2560 Stiffness of unspecified joint, not elsewhere classified: Secondary | ICD-10-CM

## 2015-08-28 NOTE — Therapy (Signed)
[place] MAIN [facility] SERVICES [address] [place], [place], [address] Phone: [phone]   Fax:  [phone]  Physical Therapy Treatment Physical Therapy Progress Note 08/13/15 to 08/28/15  Patient Details  Name: Amy Sims MRN: [id number] Date of Birth: 12-Dec-1956 Referring Provider:  Otila Back, MD  Encounter Date: 08/28/2015      PT End of Session - 08/28/15 1709    Visit Number 16   Number of Visits 25   Date for PT Re-Evaluation 10/09/15   PT Start Time 1600   PT Stop Time 1645   PT Time Calculation (min) 45 min   Equipment Utilized During Treatment --  sling   Activity Tolerance Patient tolerated treatment well;Patient limited by pain  pain at end range   Behavior During Therapy [facility] for tasks assessed/performed      Past Medical History  Diagnosis Date  . Generalized headaches   . 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 Tooth extraction      There were no vitals filed for this visit.  Visit Diagnosis:  Weakness of shoulder - Plan: PT plan of care cert/re-cert  Stiffness in joint - Plan: PT plan of care cert/re-cert      Subjective Assessment - 08/28/15 1707    Subjective pt reports she is trying to do more with her arm at home. pt reports her arm is easily fatigued.    Patient is accompained by: Family member   Patient Stated Goals full use of arm   Currently in Pain? No/denies   Pain Score 0-No pain   Pain Onset 1 to 4 weeks ago       ROM: PROM: flexion 125 deg, abduction 105 deg, ER 25 deg, IR 20 deg AROM: flexion 55deg, abudction 51 deg, ER 25 deg, IR to sacrum   Quick dash: 52% disability  Therex: UBE x 2 min no charge warm up Wall slide 2x5 (flexion) tricep ext: yellow band 2x10 Biceps yellow band 2x10 Low row: yellow band 2x10 Shoulder adduction yellow band 2x10 Shoulder IR yellow band 2x10 Ranger (flexion 2x5) abd x  5 Supine D2 shoulder flexion/D1 extension 2x5 each Serratus punch 1lb 2x10 Rhythmic stabs at 90 deg and 120 deg 2x30s each Pt did fair with progression of therex, needs mod cues (verbal and tactile) for proper performance and to reduce compensatory patterns of the [place] and scapular/thoracic joints. Pt reports fatigue at end of session.                           PT Education - 08/28/15 1708    Education provided Yes   Education Details education regarding her protocol, prognosis and progress. pt told she is a little behind on her protocol, but is making progress.    Person(s) Educated Patient   Methods Explanation   Comprehension Verbalized understanding             PT Long Term Goals - 08/28/15 1717    PT LONG TERM GOAL #1   Title Patient will decrease Quick DASH score by > 8 points demonstrating reduced self-reported upper extremity disability    Baseline 52% disability 9/13   Time 12   Period Weeks   Status Partially Met   PT LONG TERM GOAL #2   Title Patient will be independent with home exercise program for prevention/ self-management of  shoulder symptoms/ difficulty    Time 12   Period Weeks   Status On-going   PT LONG TERM GOAL #3   Title Pt to report 0/10 resting pain, 1-2/10 pain with self-stretching and strengthening program     Status Partially Met   PT LONG TERM GOAL #4   Title pt will reach into over head cabinet to retrieve 2lb itm x 5 with minimal shoulder compensation and pain <3/10   Time 12   Period Weeks   Status Partially Met   PT LONG TERM GOAL #5   Title pt will achieve flexion PROM to 140 deg, Abd to 150 deg, ER to 45 deg demonstrating functional ROM progress.    Baseline PROM flexion 125 deg, abduction 105 deg, ER 25 deg    Time 4   Period Weeks   Status Partially Met               Plan - 08/28/15 1715    Clinical Impression Statement pt has partially met goals at this time. pt is making progress regarding ROM and  strength, however she is somewhat behind in her program regarding both areas. pt is limited likely by anxiety as she has difficutly tolerating manual therapy to improve her ROM. PT still focusing on AAROM at this time wtih gradual improvement. pt does also have hypomobile GH capsule which remains so despite manual therapy which likely is also limiting her progress. pt would benefit from contineud skilled PT servics to furhter progerss function, ROM, strength to return pt to PLOF.    Pt will benefit from skilled therapeutic intervention in order to improve on the following deficits Decreased activity tolerance;Decreased range of motion;Decreased strength;Impaired flexibility;Impaired UE functional use;Pain   Rehab Potential Good   PT Frequency 2x / week   PT Duration 6 weeks   PT Treatment/Interventions Ultrasound;Cryotherapy;Electrical Stimulation;Moist Heat;Therapeutic exercise;Therapeutic activities;Passive range of motion;Dry needling;Manual techniques;Patient/family education;Aquatic Therapy   PT Next Visit Plan continue AAROM, isometrics, periscapular stregthening.         Problem List Patient Active Problem List   Diagnosis Date Noted  . Routine general medical examination at a health care facility 02/22/2014  . Encounter for routine gynecological examination 02/22/2014   Gorden Harms. Adilen Pavelko, PT, DPT [phone]  Wiatt Mahabir 08/28/2015, 5:20 PM  [place] MAIN [facility] SERVICES [address] [place], [place], [address] Phone: [phone]   Fax:  [phone]

## 2015-08-30 ENCOUNTER — Ambulatory Visit: Payer: 59

## 2015-08-30 DIAGNOSIS — M2560 Stiffness of unspecified joint, not elsewhere classified: Secondary | ICD-10-CM

## 2015-08-30 DIAGNOSIS — R29898 Other symptoms and signs involving the musculoskeletal system: Secondary | ICD-10-CM | POA: Diagnosis not present

## 2015-08-30 NOTE — Therapy (Addendum)
[place] MAIN [facility] SERVICES [address] [place], [place], [address] Phone: [phone]   Fax:  [phone]  Physical Therapy Treatment  Patient Details  Name: Giannamarie Paulus MRN: [id number] Date of Birth: 03-31-1956 Referring Provider:  Otila Back, MD  Encounter Date: 08/30/2015    Past Medical History  Diagnosis Date  . Generalized headaches   . 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 Tooth extraction      There were no vitals filed for this visit.  Visit Diagnosis:  Weakness of shoulder  Stiffness in joint  therex: UBE x 2 min no chage warm up D2 shoulder flexion AAROM with band (yellow)_ 2x10  D1 shoulder extension with yellow band 2x10 Triceps ext yellow 2x10 Biceps curl red band  2x10 UE ranger- flexion x 10 with mod-max verbal/ tactile cue to reduce shoulder shrug ABCs with UE ranger A-Z  mod-max verbal/ tactile cue to reduce shoulder shrug Shoulder press AAROM with wand 2x5 Standing shoulder flexion with want AAROM 2x5  [place]: Continuous : 1.24mz to anterior shoulder 2.463m \Manual therapy PROM into shoulder flexion/abd / ER with GHChatfieldlide into appropriate kinematices x 10 each way Ischemic trigger point release to sub scap and teres minor/major                                   PT Long Term Goals - 08/28/15 1717    PT LONG TERM GOAL #1   Title Patient will decrease Quick DASH score by > 8 points demonstrating reduced self-reported upper extremity disability    Baseline 52% disability 9/13   Time 12   Period Weeks   Status Partially Met   PT LONG TERM GOAL #2   Title Patient will be independent with home exercise program for prevention/ self-management of shoulder symptoms/ difficulty    Time 12   Period Weeks   Status On-going   PT LONG TERM GOAL #3   Title Pt to report 0/10 resting pain, 1-2/10 pain with  self-stretching and strengthening program     Status Partially Met   PT LONG TERM GOAL #4   Title pt will reach into over head cabinet to retrieve 2lb itm x 5 with minimal shoulder compensation and pain <3/10   Time 12   Period Weeks   Status Partially Met   PT LONG TERM GOAL #5   Title pt will achieve flexion PROM to 140 deg, Abd to 150 deg, ER to 45 deg demonstrating functional ROM progress.    Baseline PROM flexion 125 deg, abduction 105 deg, ER 25 deg    Time 4   Period Weeks   Status Partially Met               Problem List Patient Active Problem List   Diagnosis Date Noted  . Routine general medical examination at a health care facility 02/22/2014  . Encounter for routine gynecological examination 02/22/2014   AsGorden HarmsTortorici, PT, DPT #[id number],Tiaira Arambula 08/30/2015, 6:22 PM  [place] [facility] [id number]: [phone] Fax:  [phone]

## 2015-09-04 ENCOUNTER — Ambulatory Visit: Payer: 59

## 2015-09-04 DIAGNOSIS — M2560 Stiffness of unspecified joint, not elsewhere classified: Secondary | ICD-10-CM

## 2015-09-04 DIAGNOSIS — R29898 Other symptoms and signs involving the musculoskeletal system: Secondary | ICD-10-CM | POA: Diagnosis not present

## 2015-09-04 DIAGNOSIS — M6281 Muscle weakness (generalized): Secondary | ICD-10-CM

## 2015-09-06 ENCOUNTER — Ambulatory Visit: Payer: 59

## 2015-09-06 DIAGNOSIS — R29898 Other symptoms and signs involving the musculoskeletal system: Secondary | ICD-10-CM

## 2015-09-06 DIAGNOSIS — M2560 Stiffness of unspecified joint, not elsewhere classified: Secondary | ICD-10-CM

## 2015-09-06 DIAGNOSIS — M6281 Muscle weakness (generalized): Secondary | ICD-10-CM

## 2015-09-06 NOTE — Therapy (Signed)
[place] MAIN [facility] SERVICES [address] [place], [place], [address] Phone: [phone]   Fax:  [phone]  Physical Therapy Treatment  Patient Details  Name: Amy Sims MRN: [id number] Date of Birth: January 31, 1956 Referring Provider:  Otila Back, MD  Encounter Date: 09/06/2015      PT End of Session - 09/06/15 1810    Visit Number 19   Number of Visits 25   Date for PT Re-Evaluation 10/09/15   PT Start Time 1025   PT Stop Time 1735   PT Time Calculation (min) 48 min   Equipment Utilized During Treatment --  sling   Activity Tolerance Patient tolerated treatment well;Patient limited by pain  pain at end range   Behavior During Therapy [facility] for tasks assessed/performed      Past Medical History  Diagnosis Date  . Generalized headaches   . 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 Tooth extraction      There were no vitals filed for this visit.  Visit Diagnosis:  Weakness of shoulder  Muscle weakness  Stiffness in joint      Subjective Assessment - 09/06/15 1809    Subjective pt reports working hard on her arm at home, but reports "it just wont go."   Patient is accompained by: Family member   Patient Stated Goals full use of arm   Currently in Pain? Yes   Pain Score 2    Pain Location --  R shoulder   Pain Onset 1 to 4 weeks ago        Manual therapy: GH joint mobs grade 3-4 as tolerated by patient Inferior, AP/PA, posterior and long axis 30s x 3-4 bouts each Mobilization with movement into shoulder flexion and ER 2x10 each PROM with over pressure and small oscillations at end range x 10 flexion, extension IR/ER  PROM to end ranges of shoulder ROM as tolerated  Pt has poor GH joint excursion with mobilizations, very hypomobile  Therex: UBE x 2 min no charge warm up Ranger: flexion/ abduction x 10 each Ranger ABCs A-Z D1 extension/ D2  flexion using yellow band 2x10 each with tactile scapular excursion Mini wall push up with + 2x10 biceps red band 2x10   triceps with yellow band 2x10 horiz shoulder abduction in standing x 5 - hold due to poor kinematics Lat pull down yellow band 2x10 Pt requires mod verbal and tactile cues for proper exercise performance   Pt has near symmetric scapular rhythm mobility. Pt shoulder restriction limited to [facility] joint                      PT Education - 09/06/15 1810    Education provided Yes   Education Details concern for lack of progress regarding ROM and poor GH mobility.    Person(s) Educated Patient   Methods Explanation   Comprehension Verbalized understanding             PT Long Term Goals - 09/06/15 1749    PT LONG TERM GOAL #1   Title Patient will decrease Quick DASH score by > 8 points demonstrating reduced self-reported upper extremity disability    Baseline 52% disability 9/13   Time 12   Period Weeks   Status Partially Met   PT LONG TERM GOAL #2   Title Patient will be independent with home exercise program for  prevention/ self-management of shoulder symptoms/ difficulty    Time 12   Period Weeks   Status On-going   PT LONG TERM GOAL #3   Title Pt to report 0/10 resting pain, 1-2/10 pain with self-stretching and strengthening program     Status Partially Met   PT LONG TERM GOAL #4   Title pt will reach into over head cabinet to retrieve 2lb itm x 5 with minimal shoulder compensation and pain <3/10   Time 12   Period Weeks   Status Partially Met   PT LONG TERM GOAL #5   Title pt will achieve flexion PROM to 140 deg, Abd to 150 deg, ER to 45 deg demonstrating functional ROM progress.    Baseline PROM flexion 125 deg, abduction 105 deg, ER 30 deg    Time 4   Period Weeks   Status Partially Met               Plan - 09/06/15 1810    Clinical Impression Statement pt is struggling to progress towards goals particularly ROM. pt has  little pain with her available ROM suggesting her issue is not contractile. pt has poor GH joint mobility and has not been benefiting greatly from multiple manual therapy techniques attempted. pt is not able to tolerate more aggressive manual techniques. pt has made gains in strength and function however and is progressing gradually with therex. discuesed concern for ROM deficit with pt today. she is going to f/u with surgeon tomorrow.    Pt will benefit from skilled therapeutic intervention in order to improve on the following deficits Decreased activity tolerance;Decreased range of motion;Decreased strength;Impaired flexibility;Impaired UE functional use;Pain   Rehab Potential Good   PT Frequency 2x / week   PT Duration 6 weeks   PT Treatment/Interventions Ultrasound;Cryotherapy;Electrical Stimulation;Moist Heat;Therapeutic exercise;Therapeutic activities;Passive range of motion;Dry needling;Manual techniques;Patient/family education;Aquatic Therapy   PT Next Visit Plan continue AAROM, AROM periscapular stregthening.         Problem List Patient Active Problem List   Diagnosis Date Noted  . Routine general medical examination at a health care facility 02/22/2014  . Encounter for routine gynecological examination 02/22/2014   Gorden Harms. Tortorici, PT, DPT [phone]  Tortorici,Ashley 09/06/2015, 6:13 PM  [place] MAIN [facility] SERVICES [address] [place], [place], [address] Phone: [phone]   Fax:  [phone]

## 2015-09-06 NOTE — Therapy (Signed)
[place] MAIN [facility] SERVICES [address] [place], [place], [address] Phone: [phone]   Fax:  [phone]  Physical Therapy Treatment. Physical Therapy Progress Note 08/13/15 to 09/04/15  Patient Details  Name: Amy Sims MRN: [id number] Date of Birth: 1956/12/02 Referring Provider:  Otila Back, MD  Encounter Date: 09/04/2015      PT End of Session - 09/05/15 1557    Visit Number 18   Number of Visits 25   Date for PT Re-Evaluation 10/09/15   PT Start Time 7408   PT Stop Time 1830   PT Time Calculation (min) 60 min   Equipment Utilized During Treatment --  sling   Activity Tolerance Patient tolerated treatment well;Patient limited by pain  pain at end range   Behavior During Therapy [facility] for tasks assessed/performed      Past Medical History  Diagnosis Date  . Generalized headaches   . 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 Tooth extraction      There were no vitals filed for this visit.  Visit Diagnosis:  Weakness of shoulder  Muscle weakness  Stiffness in joint      Subjective Assessment - 09/05/15 1555    Subjective pt reports she is using her arm more in her daily life. she is still struggling to make gains with her ROM   Patient is accompained by: Family member   Patient Stated Goals full use of arm   Currently in Pain? Yes   Pain Score 2    Pain Location --  R shoulder   Pain Onset 1 to 4 weeks ago       ROM:  Flexion 120 deg, abduction 110 deg, ER 30 deg, IR 35 deg  Modified MMT: due to ROM limitation Flexion 3+/5, abduction 3/5, ER 4-/5, IR 4/5  Manual therapy: GH joint mobs grade 3-4 as tolerated by patient Inferior, AP/PA, posterior and long axis 30s x 3-4 bouts each Extensive palpation to attempt to identify source of posterior arm pain. Pt is somewhat tender in teres minor and triceps. (-) neural tension testing Patient  received dry needling therapy education and acknowledged understanding of risks and benefits of dry needling therapy prior to receiving treatment. Patient voiced understanding of treatment options and elected to proceed with dry needling therapy.   Trigger point dry needling to the R posterior deltoid. Light deep ache noted, no twitch response  PROM to end ranges of shoulder ROM as tolerated  Pt has poor GH joint excursion with mobilizations, very hypomobile  Therex: UBE x 2 min no charge warm up Ranger: flexion/ abduction x 10 each Ranger ABCs A-Z D1 extension/ D2 flexion using yellow band 2x10 each shoulder flexion AAROM with want in standing 2x5 Wall plank with + 2x10 Wall slide with lift off x 1- PT hold due to poor ROM sidelying abduction 2x10 0lbs sidelying flexino 0lbs 2x10 Prone flexion x 2 (hold due to lack of ROM even in gravity minimized) Rhythmic stabilizations 30s at 90, 120 deg 30s x 3 each Pt requries mod cues for proper performance of exercises both verbal and tactile                           PT Education - 09/05/15 1557    Education provided Yes   Education Details pt instructed to focus on her ROM  gains at home vs. strengthening. dry needling education   Person(s) Educated Patient   Methods Explanation   Comprehension Verbalized understanding             PT Long Term Goals - 09/06/15 1749    PT LONG TERM GOAL #1   Title Patient will decrease Quick DASH score by > 8 points demonstrating reduced self-reported upper extremity disability    Baseline 52% disability 9/13   Time 12   Period Weeks   Status Partially Met   PT LONG TERM GOAL #2   Title Patient will be independent with home exercise program for prevention/ self-management of shoulder symptoms/ difficulty    Time 12   Period Weeks   Status On-going   PT LONG TERM GOAL #3   Title Pt to report 0/10 resting pain, 1-2/10 pain with self-stretching and strengthening program      Status Partially Met   PT LONG TERM GOAL #4   Title pt will reach into over head cabinet to retrieve 2lb itm x 5 with minimal shoulder compensation and pain <3/10   Time 12   Period Weeks   Status Partially Met   PT LONG TERM GOAL #5   Title pt will achieve flexion PROM to 140 deg, Abd to 150 deg, ER to 45 deg demonstrating functional ROM progress.    Baseline PROM flexion 125 deg, abduction 105 deg, ER 30 deg    Time 4   Period Weeks   Status Partially Met               Plan - 09/05/15 1557    Clinical Impression Statement pt has made little gains in her ROM since last recheck. pt has little pain until she reaches about 110-120 deg flexion where she reports posterior upper arm pain and some lateral shoulder pain which limits further ROM due to pain. PT was not able to palpate the exact source of this pain today. pt is limited  in ER>abd>flexion and has very little GH excursion which may suggest capsular pattern consistent with adhesive capsulitis. Pt is making slow progress towards goals at this time but would benefit from continued skilled PT services to maximize function    Pt will benefit from skilled therapeutic intervention in order to improve on the following deficits Decreased activity tolerance;Decreased range of motion;Decreased strength;Impaired flexibility;Impaired UE functional use;Pain   Rehab Potential Good   PT Frequency 2x / week   PT Duration 6 weeks   PT Treatment/Interventions Ultrasound;Cryotherapy;Electrical Stimulation;Moist Heat;Therapeutic exercise;Therapeutic activities;Passive range of motion;Dry needling;Manual techniques;Patient/family education;Aquatic Therapy   PT Next Visit Plan continue AAROM, AROM periscapular stregthening.         Problem List Patient Active Problem List   Diagnosis Date Noted  . Routine general medical examination at a health care facility 02/22/2014  . Encounter for routine gynecological examination 02/22/2014   Gorden Harms.  Tortorici, PT, DPT [phone]  Tortorici,Ashley 09/06/2015, 5:50 PM  [place] MAIN [facility] SERVICES [address] [place], [place], [address] Phone: [phone]   Fax:  [phone]

## 2015-09-11 ENCOUNTER — Ambulatory Visit: Payer: 59

## 2015-09-11 DIAGNOSIS — M6281 Muscle weakness (generalized): Secondary | ICD-10-CM

## 2015-09-11 DIAGNOSIS — R29898 Other symptoms and signs involving the musculoskeletal system: Secondary | ICD-10-CM

## 2015-09-12 NOTE — Therapy (Signed)
[place] MAIN [facility] SERVICES [address] [place], [place], [address] Phone: [phone]   Fax:  [phone]  Physical Therapy Treatment  Patient Details  Name: Amy Sims MRN: [id number] Date of Birth: 1956-10-23 Referring Provider:  Otila Back, MD  Encounter Date: 09/11/2015      PT End of Session - 09/12/15 1755    Visit Number 20   Number of Visits 25   Date for PT Re-Evaluation 10/09/15   PT Start Time 1500   PT Stop Time 1545   PT Time Calculation (min) 45 min   Equipment Utilized During Treatment --  sling   Activity Tolerance Patient tolerated treatment well;Patient limited by pain  pain at end range   Behavior During Therapy [facility] for tasks assessed/performed      Past Medical History  Diagnosis Date  . Generalized headaches   . 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 Tooth extraction      There were no vitals filed for this visit.  Visit Diagnosis:  Weakness of shoulder  Muscle weakness      Subjective Assessment - 09/12/15 1754    Subjective pt reports she went to f/u with Dr. Virginia Rochester. she reports he is not concerned, but does agree she is behind on her protocol. pt reports he gave her a corticosteroid injection which she feels helped her pain   Patient is accompained by: Family member   Patient Stated Goals full use of arm   Currently in Pain? Yes   Pain Score 2    Pain Location --  R shoulder   Pain Onset 1 to 4 weeks ago        Manual therapy: GH joint mobs grade 3-4 as tolerated by patient Inferior, AP/PA, posterior and long axis 30s x 3-4 bouts each PROM to end ranges of shoulder ROM (flexion and ER) as tolerated  Pt has improved GH joint excursion with mobilizations,  Hypomobile Shoulder ER 35deg today Flexion 125 deg    Therex: UBE x 2 min no charge warm up Ranger: flexion/ abduction x 10 each Ranger ABCs A-Z D1  extension/ D2 flexion using yellow band 2x10 each with tactile scapular excursion Mini incline push up with + 2x10 biceps red band 2x15 triceps with yellow band 2x10 Lat pull down yellow band 2x15 Shoulder AAROM with yellow band- flexion 3x5 Pt requires mod verbal and tactile cues for proper exercise performance  Pt has near symmetric scapular rhythm mobility. Pt shoulder restriction limited to [facility] joint                               PT Long Term Goals - 09/06/15 1749    PT LONG TERM GOAL #1   Title Patient will decrease Quick DASH score by > 8 points demonstrating reduced self-reported upper extremity disability    Baseline 52% disability 9/13   Time 12   Period Weeks   Status Partially Met   PT LONG TERM GOAL #2   Title Patient will be independent with home exercise program for prevention/ self-management of shoulder symptoms/ difficulty    Time 12   Period Weeks   Status On-going   PT LONG TERM GOAL #3   Title Pt to report 0/10 resting pain, 1-2/10 pain with self-stretching and strengthening program     Status Partially  Met   PT LONG TERM GOAL #4   Title pt will reach into over head cabinet to retrieve 2lb itm x 5 with minimal shoulder compensation and pain <3/10   Time 12   Period Weeks   Status Partially Met   PT LONG TERM GOAL #5   Title pt will achieve flexion PROM to 140 deg, Abd to 150 deg, ER to 45 deg demonstrating functional ROM progress.    Baseline PROM flexion 125 deg, abduction 105 deg, ER 30 deg    Time 4   Period Weeks   Status Partially Met               Plan - 09/12/15 1755    Clinical Impression Statement pt did demonstrate some improved ROM and more tolerance to manual therapy today following a cortisone injection. PT was able to effectively mobilize the hypomobile GH joint. pt urged to focus on ROM at home to restore ROM before the injection wears off.    Pt will benefit from skilled therapeutic intervention in order to  improve on the following deficits Decreased activity tolerance;Decreased range of motion;Decreased strength;Impaired flexibility;Impaired UE functional use;Pain   Rehab Potential Good   PT Frequency 2x / week   PT Duration 6 weeks   PT Treatment/Interventions Ultrasound;Cryotherapy;Electrical Stimulation;Moist Heat;Therapeutic exercise;Therapeutic activities;Passive range of motion;Dry needling;Manual techniques;Patient/family education;Aquatic Therapy   PT Next Visit Plan continue AAROM, AROM periscapular stregthening.         Problem List Patient Active Problem List   Diagnosis Date Noted  . Routine general medical examination at a health care facility 02/22/2014  . Encounter for routine gynecological examination 02/22/2014   Gorden Harms. Sims, PT, DPT [phone]  Sims,Amy 09/12/2015, 5:58 PM  [place] MAIN [facility] SERVICES [address] [place], [place], [address] Phone: [phone]   Fax:  [phone]

## 2015-09-13 ENCOUNTER — Ambulatory Visit: Payer: 59

## 2015-09-13 DIAGNOSIS — R29898 Other symptoms and signs involving the musculoskeletal system: Secondary | ICD-10-CM

## 2015-09-13 DIAGNOSIS — M6281 Muscle weakness (generalized): Secondary | ICD-10-CM

## 2015-09-13 DIAGNOSIS — M2560 Stiffness of unspecified joint, not elsewhere classified: Secondary | ICD-10-CM

## 2015-09-13 NOTE — Therapy (Signed)
[place] MAIN [facility] SERVICES [address] [place], [place], [address] Phone: [phone]   Fax:  [phone]  Physical Therapy Treatment  Patient Details  Name: Amy Sims MRN: [id number] Date of Birth: 09-08-1956 Referring Provider:  Otila Back, MD  Encounter Date: 09/13/2015      PT End of Session - 09/13/15 1621    Visit Number 21   Number of Visits 25   Date for PT Re-Evaluation 10/09/15   PT Start Time 1500   PT Stop Time 1544   PT Time Calculation (min) 44 min   Equipment Utilized During Treatment --  sling   Activity Tolerance Patient tolerated treatment well;Patient limited by pain  pain at end range   Behavior During Therapy [facility] for tasks assessed/performed      Past Medical History  Diagnosis Date  . Generalized headaches   . 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 Tooth extraction      There were no vitals filed for this visit.  Visit Diagnosis:  Weakness of shoulder  Stiffness in joint  Muscle weakness      Subjective Assessment - 09/13/15 1620    Subjective pt reports her shoulder is a little sore , but other than that she is feeling fine   Patient is accompained by: Family member   Patient Stated Goals full use of arm   Currently in Pain? Yes   Pain Score 1    Pain Location --  R shoulder    Pain Onset 1 to 4 weeks ago     MHP applied to R shoulder x 3 min prior to manual therapy  GH joint mobs grade 3-4 as tolerated by patient Inferior, AP/PA, posterior and long axis 30s x 3-4 bouts each PROM to end ranges of shoulder ROM (flexion, abd, IR and ER) as tolerated using contract/relax Pt has improved GH joint excursion with mobilizations, Hypomobile  Therex: Ball rolls up wall 2x10 Shoulder ER with wand x 10 Ball bouce on wall 2x10 at chest level Ball on wall push up 2x10 tball slam with shoulder AAROM 2x10 iso T ball pec  squeeze x 10 Shoulder flexion AAROM with wand with 3lb weight x 10 (5s hold) Shoulder ER in SL 1lb 2x10 Shoulder flexion in SL 1lb 3x5 Shoulder abduction in SL 2x8 Pt requires min-mod cues for proper exercise performance.                            PT Education - 09/13/15 1620    Education provided Yes   Education Details strength training no more than 4 x week to allow for muscle recovery   Person(s) Educated Patient   Methods Explanation   Comprehension Verbalized understanding             PT Long Term Goals - 09/06/15 1749    PT LONG TERM GOAL #1   Title Patient will decrease Quick DASH score by > 8 points demonstrating reduced self-reported upper extremity disability    Baseline 52% disability 9/13   Time 12   Period Weeks   Status Partially Met   PT LONG TERM GOAL #2   Title Patient will be independent with home exercise program for prevention/ self-management of shoulder symptoms/ difficulty    Time 12   Period Weeks   Status On-going   PT  LONG TERM GOAL #3   Title Pt to report 0/10 resting pain, 1-2/10 pain with self-stretching and strengthening program     Status Partially Met   PT LONG TERM GOAL #4   Title pt will reach into over head cabinet to retrieve 2lb itm x 5 with minimal shoulder compensation and pain <3/10   Time 12   Period Weeks   Status Partially Met   PT LONG TERM GOAL #5   Title pt will achieve flexion PROM to 140 deg, Abd to 150 deg, ER to 45 deg demonstrating functional ROM progress.    Baseline PROM flexion 125 deg, abduction 105 deg, ER 30 deg    Time 4   Period Weeks   Status Partially Met               Plan - 09/13/15 1621    Clinical Impression Statement pt did well with progression of AAROM, shoulder strengthening and stability today. pt demonstrates more ER ROM in sidelying. pt does still need min-mod cues to perform exercises properly and minimize compensatory strategies.    Pt will benefit from skilled  therapeutic intervention in order to improve on the following deficits Decreased activity tolerance;Decreased range of motion;Decreased strength;Impaired flexibility;Impaired UE functional use;Pain   Rehab Potential Good   PT Frequency 2x / week   PT Duration 6 weeks   PT Treatment/Interventions Ultrasound;Cryotherapy;Electrical Stimulation;Moist Heat;Therapeutic exercise;Therapeutic activities;Passive range of motion;Dry needling;Manual techniques;Patient/family education;Aquatic Therapy   PT Next Visit Plan continue AAROM, AROM periscapular stregthening.         Problem List Patient Active Problem List   Diagnosis Date Noted  . Routine general medical examination at a health care facility 02/22/2014  . Encounter for routine gynecological examination 02/22/2014   Gorden Harms. Cylis Ayars, PT, DPT [phone]  Axl Rodino 09/13/2015, 4:35 PM  [place] MAIN [facility] SERVICES [address] [place], [place], [address] Phone: [phone]   Fax:  [phone]

## 2015-09-18 ENCOUNTER — Ambulatory Visit: Payer: 59 | Attending: Orthopaedic Surgery

## 2015-09-18 DIAGNOSIS — M6281 Muscle weakness (generalized): Secondary | ICD-10-CM | POA: Insufficient documentation

## 2015-09-18 DIAGNOSIS — R29898 Other symptoms and signs involving the musculoskeletal system: Secondary | ICD-10-CM | POA: Diagnosis present

## 2015-09-18 DIAGNOSIS — M2560 Stiffness of unspecified joint, not elsewhere classified: Secondary | ICD-10-CM | POA: Diagnosis present

## 2015-09-18 NOTE — Therapy (Signed)
[place] MAIN [facility] SERVICES [address] [place], [place], [address] Phone: [phone]   Fax:  [phone]  Physical Therapy Treatment  Patient Details  Name: Amy Sims MRN: [id number] Date of Birth: [birth year] Referring Provider:  Otila Back, MD  Encounter Date: 09/18/2015      PT End of Session - 09/18/15 1636    Visit Number 22   Number of Visits 25   Date for PT Re-Evaluation 10/09/15   PT Start Time 1525   PT Stop Time 1625   PT Time Calculation (min) 60 min   Equipment Utilized During Treatment --  sling   Activity Tolerance Patient tolerated treatment well;Patient limited by pain  pain at end range   Behavior During Therapy [facility] for tasks assessed/performed      Past Medical History  Diagnosis Date  . Generalized headaches   . 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 Tooth extraction      There were no vitals filed for this visit.  Visit Diagnosis:  Weakness of shoulder  Stiffness in joint  Muscle weakness      Subjective Assessment - 09/18/15 1636    Subjective pt continues to work hard on her shoulder at home. pt reports she was sore after last visit, but it resolved the next day.    Patient is accompained by: Family member   Patient Stated Goals full use of arm   Currently in Pain? No/denies   Pain Onset 1 to 4 weeks ago      Manual therapy; [place] joint mobs grade 3-4 AP, inferior, PA 30s x 3 each PROM info flexion, scaption, ER with contract relax technique and small oscillations at end range x 8 reps each Seated inferior GH glide with shoulder in abduction 30s x 3  GH joint remains hypomobile  Therex: Lateral prayer stretch (L) 15s x 5 Tspine self mobs- repeated extension over chair x 10 Ball rolls on wall into flexion 2x5 90deg ball perturbations on wall 30s x 3 Eccentric shoudler flexion lowering 2x5 shoulder scaption and  abduction AROM to 30-40deg 2x5 cone reach 2x10 (PT places cones at different locations) On Matrix cable column: Crossing X D1 extension 2.5lbs 2x10 tricep push down 2.5lbs 2x10 Lat pull down 5lbs 2x10 Chest press with adduction 2x10 (7lbs) D2 shoulder extension with AAROM into flexion using 2.5lbs cable and AAROM /scapular assist from PT 2x10 Shoulder IR 2.5lbs 2x10 Shoulder ER yellow band 2x10 Pt requires from min-max verbal and tactile cues throughout therex to minimize shoulder hike and facilitate normal GH and scapular thoracic mechanics.                                 PT Education - 09/18/15 1636    Education provided Yes   Education Details work on eccentric control of exercises. prayer stretch for R lat   Person(s) Educated Patient   Methods Explanation   Comprehension Verbalized understanding;Returned demonstration             PT Long Term Goals - 09/06/15 1749    PT LONG TERM GOAL #1   Title Patient will decrease Quick DASH score by > 8 points demonstrating reduced self-reported upper extremity disability    Baseline 52% disability 9/13   Time 12   Period Weeks   Status Partially Met  PT LONG TERM GOAL #2   Title Patient will be independent with home exercise program for prevention/ self-management of shoulder symptoms/ difficulty    Time 12   Period Weeks   Status On-going   PT LONG TERM GOAL #3   Title Pt to report 0/10 resting pain, 1-2/10 pain with self-stretching and strengthening program     Status Partially Met   PT LONG TERM GOAL #4   Title pt will reach into over head cabinet to retrieve 2lb itm x 5 with minimal shoulder compensation and pain <3/10   Time 12   Period Weeks   Status Partially Met   PT LONG TERM GOAL #5   Title pt will achieve flexion PROM to 140 deg, Abd to 150 deg, ER to 45 deg demonstrating functional ROM progress.    Baseline PROM flexion 125 deg, abduction 105 deg, ER 30 deg    Time 4   Period Weeks    Status Partially Met               Plan - 09/18/15 1637    Clinical Impression Statement pt continues to demonstrate restricted [place] campsule movement likely causing a poor scapular-humeral rhythem. joint mobilizations of the shoulder seem to have minimal effect on ROM. small improvement noted with latissumus stretching and T spine joint mobs today. pt does demo small improvement in flexion AAROM and ER AAROM during exercises. focused on eccentric control/strengthening at end of session.    Pt will benefit from skilled therapeutic intervention in order to improve on the following deficits Decreased activity tolerance;Decreased range of motion;Decreased strength;Impaired flexibility;Impaired UE functional use;Pain   Rehab Potential Good   PT Frequency 2x / week   PT Duration 6 weeks   PT Treatment/Interventions Ultrasound;Cryotherapy;Electrical Stimulation;Moist Heat;Therapeutic exercise;Therapeutic activities;Passive range of motion;Dry needling;Manual techniques;Patient/family education;Aquatic Therapy   PT Next Visit Plan continue AAROM, AROM periscapular stregthening.         Problem List Patient Active Problem List   Diagnosis Date Noted  . Routine general medical examination at a health care facility 02/22/2014  . Encounter for routine gynecological examination 02/22/2014   Gorden Harms. Will Schier, PT, DPT [phone]  Amy Sims 09/18/2015, 4:40 PM  [place] MAIN [facility] SERVICES [address] [place], [place], [address] Phone: [phone]   Fax:  [phone]

## 2015-09-20 ENCOUNTER — Ambulatory Visit: Payer: 59

## 2015-09-20 DIAGNOSIS — R29898 Other symptoms and signs involving the musculoskeletal system: Secondary | ICD-10-CM

## 2015-09-20 DIAGNOSIS — M2560 Stiffness of unspecified joint, not elsewhere classified: Secondary | ICD-10-CM

## 2015-09-20 DIAGNOSIS — M6281 Muscle weakness (generalized): Secondary | ICD-10-CM

## 2015-09-20 NOTE — Therapy (Signed)
[name] MAIN [facility] SERVICES [address] [place], [place], [address] Phone: [phone]   Fax:  [phone]  Physical Therapy Treatment  Patient Details  Name: Amy Sims MRN: [id number] Date of Birth: 17-Jan-1956 Referring Provider:  Otila Back, MD  Encounter Date: 09/20/2015      PT End of Session - 09/20/15 1619    Visit Number 23   Number of Visits 25   Date for PT Re-Evaluation 10/09/15   PT Start Time 2751   PT Stop Time 1600   PT Time Calculation (min) 45 min   Equipment Utilized During Treatment --  sling   Activity Tolerance Patient tolerated treatment well;Patient limited by pain  pain at end range   Behavior During Therapy [facility] for tasks assessed/performed      Past Medical History  Diagnosis Date  . Generalized headaches   . 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 Tooth extraction      There were no vitals filed for this visit.  Visit Diagnosis:  Stiffness in joint  Weakness of shoulder  Muscle weakness      Subjective Assessment - 09/20/15 1618    Subjective pt continues to work hard on her shoulder at home. she is a little sore today.   Patient is accompained by: Family member   Patient Stated Goals full use of arm   Pain Score 1    Pain Location --  R anterior shoulder   Pain Onset 1 to 4 weeks ago      Therex: UBE x 2 min no charge Ranger flexion/ abd 2x10 each Ranger large circles CW/CCW x 5 each Ball drops 3x5 Body blade, flexion / abd / IR/ER 10s x 3 each Over head press with wand 2x5 Cable column, diagonal pull downs (D2 extension) and pull ups D2 flexion with both arms 7.5/2.5lbs x 12 each side Cable column, cross over low row 2.5lbs 2x10 Lat pull 7.5lbs 2x10 Prone horiz abduction eccentric 2x5 Prone shoulder flexion (Y) eccentric 2x5  Pt required min-mod verbal and tactile cues to minimize shoulder shrug and perform  exercises properly.    pt demonstrates improved shoulder flexion AAROM   pt reported no posterior shoulder pain today                            PT Long Term Goals - 09/06/15 1749    PT LONG TERM GOAL #1   Title Patient will decrease Quick DASH score by > 8 points demonstrating reduced self-reported upper extremity disability    Baseline 52% disability 9/13   Time 12   Period Weeks   Status Partially Met   PT LONG TERM GOAL #2   Title Patient will be independent with home exercise program for prevention/ self-management of shoulder symptoms/ difficulty    Time 12   Period Weeks   Status On-going   PT LONG TERM GOAL #3   Title Pt to report 0/10 resting pain, 1-2/10 pain with self-stretching and strengthening program     Status Partially Met   PT LONG TERM GOAL #4   Title pt will reach into over head cabinet to retrieve 2lb itm x 5 with minimal shoulder compensation and pain <3/10   Time 12   Period Weeks   Status Partially Met   PT LONG TERM GOAL #5  Title pt will achieve flexion PROM to 140 deg, Abd to 150 deg, ER to 45 deg demonstrating functional ROM progress.    Baseline PROM flexion 125 deg, abduction 105 deg, ER 30 deg    Time 4   Period Weeks   Status Partially Met               Plan - 09/20/15 1619    Clinical Impression Statement pt demonstrates improved RUE strength overall, but particular weakness in posterior delt, middle and lower trap. initiated eccentric strengthening of these muscles today. PT unable to palpate areas of tenderness throughout shoulder girdle. pt reported her pain was 0/10 after exercise.   Pt will benefit from skilled therapeutic intervention in order to improve on the following deficits Decreased activity tolerance;Decreased range of motion;Decreased strength;Impaired flexibility;Impaired UE functional use;Pain   Rehab Potential Good   PT Frequency 2x / week   PT Duration 6 weeks   PT Treatment/Interventions  Ultrasound;Cryotherapy;Electrical Stimulation;Moist Heat;Therapeutic exercise;Therapeutic activities;Passive range of motion;Dry needling;Manual techniques;Patient/family education;Aquatic Therapy   PT Next Visit Plan continue AAROM, AROM periscapular stregthening.         Problem List Patient Active Problem List   Diagnosis Date Noted  . Routine general medical examination at a health care facility 02/22/2014  . Encounter for routine gynecological examination 02/22/2014   Gorden Harms. Yamilette Garretson, PT, DPT [phone]  [name] 09/20/2015, 4:24 PM  [place] MAIN [facility] SERVICES [address] [place], [place], [address] Phone: [phone]   Fax:  [phone]

## 2015-09-20 NOTE — Patient Instructions (Signed)
Shoulder press over head AAROM with wand 2x5

## 2015-09-24 ENCOUNTER — Ambulatory Visit: Payer: 59

## 2015-09-24 DIAGNOSIS — M2560 Stiffness of unspecified joint, not elsewhere classified: Secondary | ICD-10-CM

## 2015-09-24 DIAGNOSIS — M6281 Muscle weakness (generalized): Secondary | ICD-10-CM

## 2015-09-24 DIAGNOSIS — R29898 Other symptoms and signs involving the musculoskeletal system: Secondary | ICD-10-CM | POA: Diagnosis not present

## 2015-09-24 NOTE — Therapy (Signed)
[place] MAIN [facility] SERVICES [address] [place], [place], [address] Phone: [phone]   Fax:  [phone]  Physical Therapy Treatment  Patient Details  Name: Amy Sims MRN: [id number] Date of Birth: [birth year] Referring Provider:  Otila Back, MD  Encounter Date: 09/24/2015      PT End of Session - 09/24/15 1316    Visit Number 24   Number of Visits 37   Date for PT Re-Evaluation 10/09/15   PT Start Time 1115   PT Stop Time 1155   PT Time Calculation (min) 40 min   Equipment Utilized During Treatment --  sling   Activity Tolerance Patient tolerated treatment well;Patient limited by pain  pain at end range   Behavior During Therapy [facility] for tasks assessed/performed      Past Medical History  Diagnosis Date  . Generalized headaches   . 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 Tooth extraction      There were no vitals filed for this visit.  Visit Diagnosis:  Stiffness in joint  Weakness of shoulder  Muscle weakness      Subjective Assessment - 09/24/15 1315    Subjective pt reports stretching a lot at home and using her arm more. she reports she is progressing regarding function   Patient is accompained by: Family member   Patient Stated Goals full use of arm   Currently in Pain? No/denies   Pain Onset 1 to 4 weeks ago         Therex: UBE x 2 min no charge Ranger flexion/ abd 2x10 each Ranger large circles CW/CCW x 5 each Ball drops 3x5 abduction and flexion Shoulder flexion / abduction AAROM using yellow band 2x5 each tBall bounce on wall 2x10 TBall bounce on ground 2x10 Over head press with wand 2x5 1lbs Cable column, diagonal pull downs (D2 extension) and pull ups D2 flexion with both arms 7.5/2.5lbs 2x10 each side Cable column, cross over low row 2.5lbs 2x10 Lat pull 7.5lbs 2x10 Prone horiz abduction eccentric 2x10 Prone shoulder  flexion (Y) eccentric 2x5  Pt required min-mod verbal and tactile cues to minimize shoulder shrug and perform exercises properly.    pt demonstrates improved shoulder flexion AAROM   pt reported no posterior shoulder pain today                                  PT Long Term Goals - 09/06/15 1749    PT LONG TERM GOAL #1   Title Patient will decrease Quick DASH score by > 8 points demonstrating reduced self-reported upper extremity disability    Baseline 52% disability 9/13   Time 12   Period Weeks   Status Partially Met   PT LONG TERM GOAL #2   Title Patient will be independent with home exercise program for prevention/ self-management of shoulder symptoms/ difficulty    Time 12   Period Weeks   Status On-going   PT LONG TERM GOAL #3   Title Pt to report 0/10 resting pain, 1-2/10 pain with self-stretching and strengthening program     Status Partially Met   PT LONG TERM GOAL #4   Title pt will reach into over head cabinet to retrieve 2lb itm x 5 with minimal shoulder compensation and pain <3/10   Time 12   Period  Weeks   Status Partially Met   PT LONG TERM GOAL #5   Title pt will achieve flexion PROM to 140 deg, Abd to 150 deg, ER to 45 deg demonstrating functional ROM progress.    Baseline PROM flexion 125 deg, abduction 105 deg, ER 30 deg    Time 4   Period Weeks   Status Partially Met               Plan - 09/24/15 1318    Clinical Impression Statement pt demonstrates improved AAROM and AROM of the involved shoulder. pt had little pain in session today even with exercises that tend to bother her shoulder. sigificant weakness of the serratus, lower and mid trap noted, but progressing well with strengthening.    Pt will benefit from skilled therapeutic intervention in order to improve on the following deficits Decreased activity tolerance;Decreased range of motion;Decreased strength;Impaired flexibility;Impaired UE functional use;Pain    Rehab Potential Good   PT Frequency 2x / week   PT Duration 6 weeks   PT Treatment/Interventions Ultrasound;Cryotherapy;Electrical Stimulation;Moist Heat;Therapeutic exercise;Therapeutic activities;Passive range of motion;Dry needling;Manual techniques;Patient/family education;Aquatic Therapy   PT Next Visit Plan continue AAROM, AROM periscapular stregthening.         Problem List Patient Active Problem List   Diagnosis Date Noted  . Routine general medical examination at a health care facility 02/22/2014  . Encounter for routine gynecological examination 02/22/2014   Gorden Harms. Cason Luffman, PT, DPT [phone]  Randal Yepiz 09/24/2015, 1:20 PM  [place] [facility] MAIN [facility] SERVICES [address] [place], [place], [address] Phone: [phone]   Fax:  [phone]

## 2015-09-26 ENCOUNTER — Ambulatory Visit: Payer: 59

## 2015-09-26 DIAGNOSIS — M2560 Stiffness of unspecified joint, not elsewhere classified: Secondary | ICD-10-CM

## 2015-09-26 DIAGNOSIS — R29898 Other symptoms and signs involving the musculoskeletal system: Secondary | ICD-10-CM

## 2015-09-26 DIAGNOSIS — M6281 Muscle weakness (generalized): Secondary | ICD-10-CM

## 2015-09-27 NOTE — Therapy (Signed)
[place] MAIN [facility] SERVICES [address] [place], [place], [address] Phone: [phone]   Fax:  [phone]  Physical Therapy Treatment  Patient Details  Name: Jameia Makris MRN: [id number] Date of Birth: 1956/01/29 Referring Provider:  Otila Back, MD  Encounter Date: 09/26/2015      PT End of Session - 09/27/15 0814    Visit Number 25   Number of Visits 37   Date for PT Re-Evaluation 10/09/15   PT Start Time 0800   PT Stop Time 0845   PT Time Calculation (min) 45 min   Equipment Utilized During Treatment --  sling   Activity Tolerance Patient tolerated treatment well;Patient limited by pain  pain at end range   Behavior During Therapy [facility] for tasks assessed/performed      Past Medical History  Diagnosis Date  . Generalized headaches   . 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 Tooth extraction      There were no vitals filed for this visit.  Visit Diagnosis:  Stiffness in joint  Weakness of shoulder  Muscle weakness      Subjective Assessment - 09/27/15 0814    Subjective Pt relates she is doing well and performing her HEP daily.  Pt denies any pain currently   Patient is accompained by: Family member   Patient Stated Goals full use of arm   Currently in Pain? No/denies   Pain Score 0-No pain   Pain Onset 1 to 4 weeks ago      Therex: UBE x 2 min no charge Ranger flexion/ abd 2x10 each Ranger large circles CW/CCW x 10 each Ball drops 3x5 abduction and flexion Shoulder flexion / abduction AAROM using yellow band 2x8 each Cable column, diagonal pull downs (D2 extension) and pull ups D2 flexion with both arms 7.5/2.5lbs 2x10 each side Cable column, cross over low row 2.5lbs 2x10 Lat pull 7.5lbs 2x10 Prone horiz abduction eccentric 2x10 Prone shoulder flexion (Y) eccentric 2x10   Pt required verbal and tactile cues to decrease right shoulder  shrug, especially during ball drops and cues for correct exercise technique                                 PT Long Term Goals - 09/06/15 1749    PT LONG TERM GOAL #1   Title Patient will decrease Quick DASH score by > 8 points demonstrating reduced self-reported upper extremity disability    Baseline 52% disability 9/13   Time 12   Period Weeks   Status Partially Met   PT LONG TERM GOAL #2   Title Patient will be independent with home exercise program for prevention/ self-management of shoulder symptoms/ difficulty    Time 12   Period Weeks   Status On-going   PT LONG TERM GOAL #3   Title Pt to report 0/10 resting pain, 1-2/10 pain with self-stretching and strengthening program     Status Partially Met   PT LONG TERM GOAL #4   Title pt will reach into over head cabinet to retrieve 2lb itm x 5 with minimal shoulder compensation and pain <3/10   Time 12   Period Weeks   Status Partially Met   PT LONG TERM GOAL #5   Title pt will achieve flexion PROM to 140 deg, Abd to 150 deg,  ER to 45 deg demonstrating functional ROM progress.    Baseline PROM flexion 125 deg, abduction 105 deg, ER 30 deg    Time 4   Period Weeks   Status Partially Met               Plan - 09/27/15 0815    Clinical Impression Statement Pt had minimal pain during session and requires short rest breaks due to shoulder fatigue.  Pt increases right shoulder shrug as she fatigues.    Pt will benefit from skilled therapeutic intervention in order to improve on the following deficits Decreased activity tolerance;Decreased range of motion;Decreased strength;Impaired flexibility;Impaired UE functional use;Pain   Rehab Potential Good   PT Frequency 2x / week   PT Duration 6 weeks   PT Treatment/Interventions Ultrasound;Cryotherapy;Electrical Stimulation;Moist Heat;Therapeutic exercise;Therapeutic activities;Passive range of motion;Dry needling;Manual techniques;Patient/family  education;Aquatic Therapy   PT Next Visit Plan continue AAROM, AROM periscapular stregthening.         Problem List Patient Active Problem List   Diagnosis Date Noted  . Routine general medical examination at a health care facility 02/22/2014  . Encounter for routine gynecological examination 02/22/2014   Renford Dills, SPT This entire session was performed under direct supervision and direction of a licensed therapist/therapist assistant . I have personally read, edited and approve of the note as written. Gorden Harms. Tortorici, PT, DPT [phone]  Tortorici,Ashley 09/27/2015, 9:24 AM  [place] [facility] MAIN [facility] SERVICES [address] [place], [place], [address] Phone: [phone]   Fax:  [phone]

## 2015-10-01 ENCOUNTER — Ambulatory Visit: Payer: 59

## 2015-10-01 DIAGNOSIS — M2560 Stiffness of unspecified joint, not elsewhere classified: Secondary | ICD-10-CM

## 2015-10-01 DIAGNOSIS — R29898 Other symptoms and signs involving the musculoskeletal system: Secondary | ICD-10-CM

## 2015-10-03 NOTE — Therapy (Signed)
[place] MAIN [facility] SERVICES [address] [place], [place], [address] Phone: [phone]   Fax:  [phone]  Physical Therapy Treatment  Patient Details  Name: Amy Sims MRN: [id number] Date of Birth: June 09, 1956 No Data Recorded  Encounter Date: 10/01/2015      PT End of Session - 10/03/15 0818    Visit Number 26   Number of Visits 37   Date for PT Re-Evaluation 10/09/15   PT Start Time 1640   PT Stop Time 1725   PT Time Calculation (min) 45 min   Equipment Utilized During Treatment --  sling   Activity Tolerance Patient tolerated treatment well;Patient limited by pain  pain at end range   Behavior During Therapy [facility] for tasks assessed/performed      Past Medical History  Diagnosis Date  . Generalized headaches   . 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 Tooth extraction      There were no vitals filed for this visit.  Visit Diagnosis:  Stiffness in joint  Weakness of shoulder      Subjective Assessment - 10/03/15 0814    Subjective pt reports she is compliant with HEP. pt reports she has very little pain, only weakness. pt brought her adult son with her so he will be able to help with some exercises after PT DC.    Patient is accompained by: Family member   Patient Stated Goals full use of arm   Currently in Pain? No/denies   Pain Onset 1 to 4 weeks ago      UBE x 2 min 1.5 resistance no charge warm up Ball roll AAROM up wall 2x10 Over head Tball bounce on wall 2x10 Sitting lat pull on Matrix Cable column 2x10 - assist as needed with RUE Attempted shoulder horiz abduction with cable column- but not able to perform adequately, modified to supine with Tband shoulder horiz abd yellow band 2x10 prone AAROM horiz abd and flexion with instruction on son to help. 2x10 with cues for eccentric lowering Serratus punch with manual resist 2x10  over head  press 3 lbs 2x10 Pt requires min cues for proper exercise performance                           PT Education - 10/03/15 0815    Education provided Yes   Education Details pt and son demo on prone shoulder horiz abd and flexion with assist and eccentric lowering   Person(s) Educated Patient;Child(ren)   Methods Explanation;Demonstration   Comprehension Verbalized understanding;Returned demonstration             PT Long Term Goals - 09/06/15 1749    PT LONG TERM GOAL #1   Title Patient will decrease Quick DASH score by > 8 points demonstrating reduced self-reported upper extremity disability    Baseline 52% disability 9/13   Time 12   Period Weeks   Status Partially Met   PT LONG TERM GOAL #2   Title Patient will be independent with home exercise program for prevention/ self-management of shoulder symptoms/ difficulty    Time 12   Period Weeks   Status On-going   PT LONG TERM GOAL #3   Title Pt to report 0/10 resting pain, 1-2/10 pain with self-stretching and strengthening program     Status Partially Met   PT LONG TERM  GOAL #4   Title pt will reach into over head cabinet to retrieve 2lb itm x 5 with minimal shoulder compensation and pain <3/10   Time 12   Period Weeks   Status Partially Met   PT LONG TERM GOAL #5   Title pt will achieve flexion PROM to 140 deg, Abd to 150 deg, ER to 45 deg demonstrating functional ROM progress.    Baseline PROM flexion 125 deg, abduction 105 deg, ER 30 deg    Time 4   Period Weeks   Status Partially Met               Plan - 10/03/15 0818    Clinical Impression Statement pt progressing well with ROM and strengthening of the R shoulder. she is still limited in active ROM of the shoulder to about 100 deg flexion and abduction, but has nearly full AAROM. pt demostrates 60 deg ER AAROM today as well. pt demonstrates pretty good understading of strengthening exercises but still requires some cuing to minimzie  shouder shrug and other compensatory patterns.  education with son today regarding assisting pt with lower and mid trap strengthening  in prone where she still requires assist >50% of  the time. pt had no pain with therex today.    Pt will benefit from skilled therapeutic intervention in order to improve on the following deficits Decreased activity tolerance;Decreased range of motion;Decreased strength;Impaired flexibility;Impaired UE functional use;Pain   Rehab Potential Good   PT Frequency 2x / week   PT Duration 6 weeks   PT Treatment/Interventions Ultrasound;Cryotherapy;Electrical Stimulation;Moist Heat;Therapeutic exercise;Therapeutic activities;Passive range of motion;Dry needling;Manual techniques;Patient/family education;Aquatic Therapy   PT Next Visit Plan continue AAROM, AROM periscapular stregthening.         Problem List Patient Active Problem List   Diagnosis Date Noted  . Routine general medical examination at a health care facility 02/22/2014  . Encounter for routine gynecological examination 02/22/2014   Gorden Harms. Jalayiah Bibian, PT, DPT [phone]  [name] 10/03/2015, 8:24 AM  [place] MAIN [facility] SERVICES [address] [place], [place], [address] Phone: [phone]   Fax:  [phone]  Name: Amy Sims MRN: [id number] Date of Birth: September 14, 1956

## 2015-10-09 ENCOUNTER — Ambulatory Visit: Payer: 59

## 2015-10-09 DIAGNOSIS — M6281 Muscle weakness (generalized): Secondary | ICD-10-CM

## 2015-10-09 DIAGNOSIS — M2560 Stiffness of unspecified joint, not elsewhere classified: Secondary | ICD-10-CM

## 2015-10-09 DIAGNOSIS — R29898 Other symptoms and signs involving the musculoskeletal system: Secondary | ICD-10-CM

## 2015-10-10 NOTE — Therapy (Signed)
[place] MAIN [facility] SERVICES [address] [place], [place], [address] Phone: [phone]   Fax:  [phone]  Physical Therapy Treatment  Patient Details  Name: Amy Sims MRN: [id number] Date of Birth: 20-Jun-1956 No Data Recorded  Encounter Date: 10/09/2015      PT End of Session - 10/10/15 1354    Visit Number 27   Number of Visits 37   Date for PT Re-Evaluation 10/09/15   PT Start Time 1615   PT Stop Time 1656   PT Time Calculation (min) 41 min   Equipment Utilized During Treatment --  sling   Activity Tolerance Patient tolerated treatment well;Patient limited by pain  pain at end range   Behavior During Therapy [facility] for tasks assessed/performed      Past Medical History  Diagnosis Date  . Generalized headaches   . 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 Tooth extraction    . Rotator cuff repair Right     There were no vitals filed for this visit.  Visit Diagnosis:  Stiffness in joint  Weakness of shoulder  Muscle weakness      Subjective Assessment - 10/10/15 1335    Subjective pt reports she continues to do well at home. she has little pain but still cannot reach over head without assist.    Patient is accompained by: Family member   Patient Stated Goals full use of arm   Currently in Pain? No/denies   Pain Onset 1 to 4 weeks ago      UBE L 2.5 x 3 min no charge warm up Large Ranger AAROM ABCS  Body blade ER/flexion/abd 2x15s each CC D1/D2 BUE flexion  7.5lbs 2x10 each Lat pull downs in fitness center 1 plates 2x10 Row machine in fitness center 1 plate 2x10 Incline press in fitness center 1 plate 2x10 Bicep curls 1 plate 1x10 tricep push downs 2 plates 2x10 Pt requires min verbal and tactile cues for proper exercise performance  PT educated pt on general principles of exercise including AROM (gravity resistance) vs resisted exercises,  progressions regarding sets/reps weight, body mechanics and breathing technique.                            PT Education - 10/10/15 1354    Education provided Yes   Education Details general progression of exercises including gym equipment following dc   Person(s) Educated Patient   Methods Explanation   Comprehension Verbalized understanding;Returned demonstration             PT Long Term Goals - 09/06/15 1749    PT LONG TERM GOAL #1   Title Patient will decrease Quick DASH score by > 8 points demonstrating reduced self-reported upper extremity disability    Baseline 52% disability 9/13   Time 12   Period Weeks   Status Partially Met   PT LONG TERM GOAL #2   Title Patient will be independent with home exercise program for prevention/ self-management of shoulder symptoms/ difficulty    Time 12   Period Weeks   Status On-going   PT LONG TERM GOAL #3   Title Pt to report 0/10 resting pain, 1-2/10 pain with self-stretching and strengthening program     Status Partially Met   PT LONG TERM GOAL #4   Title pt will reach into over head  cabinet to retrieve 2lb itm x 5 with minimal shoulder compensation and pain <3/10   Time 12   Period Weeks   Status Partially Met   PT LONG TERM GOAL #5   Title pt will achieve flexion PROM to 140 deg, Abd to 150 deg, ER to 45 deg demonstrating functional ROM progress.    Baseline PROM flexion 125 deg, abduction 105 deg, ER 30 deg    Time 4   Period Weeks   Status Partially Met               Plan - 10/10/15 1355    Clinical Impression Statement progressed therex today to include exercises she can do at the local gym to further strengthen her shoulder girdle to maximize her function. pt has nearly full AAROM of the R shoulder but still struggles to flex and abduct the shoulder >100deg which is demonstrating continued weakness. pt is very compliant with HEP. plan to asses for DC next visit to independent strengthening  program.    Pt will benefit from skilled therapeutic intervention in order to improve on the following deficits Decreased activity tolerance;Decreased range of motion;Decreased strength;Impaired flexibility;Impaired UE functional use;Pain   Rehab Potential Good   PT Frequency 2x / week   PT Duration 6 weeks   PT Treatment/Interventions Ultrasound;Cryotherapy;Electrical Stimulation;Moist Heat;Therapeutic exercise;Therapeutic activities;Passive range of motion;Dry needling;Manual techniques;Patient/family education;Aquatic Therapy   PT Next Visit Plan --        Problem List Patient Active Problem List   Diagnosis Date Noted  . Routine general medical examination at a health care facility 02/22/2014  . Encounter for routine gynecological examination 02/22/2014   Gorden Harms. Merlyn Conley, PT, DPT [phone]   Rayhana Slider 10/10/2015, 2:12 PM  [place] MAIN [facility] SERVICES [address] [place], [place], [address] Phone: [phone]   Fax:  [phone]  Name: Tykera Skates MRN: [id number] Date of Birth: April 06, 1956

## 2015-10-18 ENCOUNTER — Ambulatory Visit: Payer: 59 | Attending: Orthopaedic Surgery

## 2015-10-18 DIAGNOSIS — M2560 Stiffness of unspecified joint, not elsewhere classified: Secondary | ICD-10-CM | POA: Diagnosis present

## 2015-10-18 DIAGNOSIS — M6281 Muscle weakness (generalized): Secondary | ICD-10-CM | POA: Diagnosis present

## 2015-10-18 DIAGNOSIS — R29898 Other symptoms and signs involving the musculoskeletal system: Secondary | ICD-10-CM | POA: Insufficient documentation

## 2015-10-18 NOTE — Therapy (Signed)
[place] MAIN [facility] SERVICES [address] [place], [place], [address] Phone: [phone]   Fax:  [phone]  Physical Therapy Treatment  Patient Details  Name: Amy Sims MRN: [id number] Date of Birth: 07/13/1956 No Data Recorded  Encounter Date: 10/18/2015      PT End of Session - 10/18/15 1636    Visit Number 28   Number of Visits 37   Date for PT Re-Evaluation 10/09/15   PT Start Time 1115   PT Stop Time 1150   PT Time Calculation (min) 35 min   Equipment Utilized During Treatment --  sling   Activity Tolerance Patient tolerated treatment well;Patient limited by pain  pain at end range   Behavior During Therapy [facility] for tasks assessed/performed      Past Medical History  Diagnosis Date  . Generalized headaches   . 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 Tooth extraction    . Rotator cuff repair Right     There were no vitals filed for this visit.  Visit Diagnosis:  Weakness of shoulder  Muscle weakness      Subjective Assessment - 10/18/15 1635    Subjective pt reports she is working hard at home to improve her shoulder strength. she reports she has a follow up with the surgeon tomorrow. she feels confident with HEP   Patient is accompained by: Family member   Patient Stated Goals full use of arm   Currently in Pain? No/denies   Pain Onset 1 to 4 weeks ago       PT exam of L shoulder: Sensation: WNL PROM: shoulder flexion 160deg, ER 60 deg, Abd 160 deg, IR 55 deg AROM: flexion 100 deg, abduction 95 deg, ER 50 deg, IR to L2 MMT: Shoulder flexion 2+/5, abduction: 2+/5, ER 4-/5, IR 4+/5  Pt reports she joined the hospital fitness center today: In fitness center: extensive pt education about safe exercise progression regarding her shoulder Rows: 2 plates 2x10 Lat pull downs 2 plates 2x10 Incline press 10lbs 2x10 AAROM with 5lbs over head press  3x3 ABCs with ball on wall Over head ball bounce 2x10 Weight bearing in quadriped with walk outs x 1 min Bend over shoulder extension, retraction, horizontal abduction and flexion on work out bench x 5 each - 0lbs Pt required min cues for proper use of equipment and exercise performance.                           PT Education - 10/18/15 1635    Education provided Yes   Education Details safe progression of exercises after DC. regarding flexion/abduction continue with AAROM and progress to AROM. add light resistance when she can flex shoulder to 140deg or above x 10. orientation to gym equipment / safety / exercise recommendations   Person(s) Educated Patient   Methods Explanation   Comprehension Verbalized understanding;Returned demonstration             PT Long Term Goals - 10/18/15 1639    PT LONG TERM GOAL #1   Title Patient will decrease Quick DASH score by > 8 points demonstrating reduced self-reported upper extremity disability    Baseline 36%   Time 12   Period Weeks   Status Achieved   PT LONG TERM GOAL #2   Title Patient will be independent with home exercise program  for prevention/ self-management of shoulder symptoms/ difficulty    Time 12   Period Weeks   Status Achieved   PT LONG TERM GOAL #3   Title Pt to report 0/10 resting pain, 1-2/10 pain with self-stretching and strengthening program     Status Achieved   PT LONG TERM GOAL #4   Title pt will reach into over head cabinet to retrieve 2lb itm x 5 with minimal shoulder compensation and pain <3/10   Time 12   Period Weeks   Status Partially Met   PT LONG TERM GOAL #5   Title pt will achieve flexion PROM to 140 deg, Abd to 150 deg, ER to 45 deg demonstrating functional ROM progress.    Baseline PROM flexion 125 deg, abduction 105 deg, ER 30 deg    Time 4   Period Weeks   Status Achieved               Plan - 10/18/15 1637    Clinical Impression Statement pt has progressed  well in therapy and now has no pain. she has full shoulder PROM and AAROM, but is still lacking full AROM of the shoulder due to muscle weakness. pt is independent with home strengthening program and will be able to self manage her progression of strengthening independently today. pt will be DC from PT today as skilled PT services are no longer needed.    Pt will benefit from skilled therapeutic intervention in order to improve on the following deficits Decreased activity tolerance;Decreased range of motion;Decreased strength;Impaired flexibility;Impaired UE functional use;Pain   Rehab Potential Good   PT Frequency 2x / week   PT Duration 6 weeks   PT Treatment/Interventions Ultrasound;Cryotherapy;Electrical Stimulation;Moist Heat;Therapeutic exercise;Therapeutic activities;Passive range of motion;Dry needling;Manual techniques;Patient/family education;Aquatic Therapy        Problem List Patient Active Problem List   Diagnosis Date Noted  . Routine general medical examination at a health care facility 02/22/2014  . Encounter for routine gynecological examination 02/22/2014   Gorden Harms. Beckham Capistran, PT, DPT [phone]  [name] 10/18/2015, 4:40 PM  [place] MAIN [facility] SERVICES [address] [place], [place], [address] Phone: [phone]   Fax:  [phone]  Name: Amy Sims MRN: [id number] Date of Birth: 1956/03/16

## 2015-10-24 ENCOUNTER — Ambulatory Visit: Payer: 59 | Admitting: Physical Therapy

## 2015-10-24 DIAGNOSIS — M2560 Stiffness of unspecified joint, not elsewhere classified: Secondary | ICD-10-CM

## 2015-10-24 DIAGNOSIS — M6281 Muscle weakness (generalized): Secondary | ICD-10-CM

## 2015-10-24 DIAGNOSIS — R29898 Other symptoms and signs involving the musculoskeletal system: Secondary | ICD-10-CM | POA: Diagnosis not present

## 2015-10-24 NOTE — Therapy (Signed)
[place] [facility] [address] [place], [address] Phone: [phone]   Fax:  [phone]  Physical Therapy Treatment  Patient Details  Name: Amy Sims MRN: [id number] Date of Birth: Jul 27, 1956 No Data Recorded  Encounter Date: 10/24/2015      PT End of Session - 10/24/15 0925    Visit Number 29   Number of Visits 37   Date for PT Re-Evaluation 12/23/14   PT Start Time 0915   PT Stop Time 1011   PT Time Calculation (min) 56 min   Equipment Utilized During Treatment --  sling   Activity Tolerance Patient tolerated treatment well;Patient limited by pain  pain at end range   Behavior During Therapy [facility] for tasks assessed/performed      Past Medical History  Diagnosis Date  . Generalized headaches   . 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 Tooth extraction    . Rotator cuff repair Right     There were no vitals filed for this visit.  Visit Diagnosis:  Stiffness in joint - Plan: PT plan of care cert/re-cert  Muscle weakness - Plan: PT plan of care cert/re-cert  Weakness of shoulder - Plan: PT plan of care cert/re-cert      Subjective Assessment - 10/24/15 0922    Subjective Pt reports she returned to her MD who wants her to continue with more "aggressive" physical therapy to address ROM limitations. Primarily limitations with ER.   Patient is accompained by: Family member   Patient Stated Goals full use of arm   Currently in Pain? No/denies   Pain Score 0-No pain   Pain Onset 1 to 4 weeks ago          Objective: Reviewed HEP, corrected self stretchfor ER.  Added weighted low load long duration stretch for improved ER, 3# wt 2x2 min, 6# wt same with manual correction when pt would avoid going into ER.  Extensive practice and correction for shoulder elevation to be performed without shoulder shrug, only into region where pt  does not have any evidence of shrug. Issued this as HEP, manual and verbal cuing for performance.  Inferior glides performed at end range for abduction and flexion 3x1 min grade IV. Pt c/o pain with this but tolerated treatment. Following this ER ROM improved to 40 degr.   C-R for ER 3x6 bouts of 5 sec. Holds.  Following session pt reported definite incr. In pain.                       PT Education - 10/24/15 0924    Education provided Yes   Education Details Educated pt on self performance of ER stretches.   Person(s) Educated Patient   Methods Explanation   Comprehension Verbalized understanding             PT Long Term Goals - 10/24/15 1018    PT LONG TERM GOAL #1   Title Patient will decrease Quick DASH score by > 8 points demonstrating reduced self-reported upper extremity disability    Baseline 36%   Time 12   Period Weeks   Status Achieved   PT LONG TERM GOAL #2   Title Patient will be independent with home exercise program for prevention/ self-management of shoulder symptoms/ difficulty    Time 12   Period Weeks   Status  Achieved   PT LONG TERM GOAL #3   Title Pt to report 0/10 resting pain, 1-2/10 pain with self-stretching and strengthening program     Status Achieved   PT LONG TERM GOAL #4   Title pt will reach into over head cabinet to retrieve 2lb itm x 5 with minimal shoulder compensation and pain <3/10   Time 12   Period Weeks   Status Partially Met   PT LONG TERM GOAL #5   Title pt will achieve flexion PROM to 140 deg, Abd to 150 deg, ER to 45 deg demonstrating functional ROM progress.    Baseline PROM flexion 125 deg, abduction 105 deg, ER 30 deg    Time 4   Period Weeks   Status Achieved   Additional Long Term Goals   Additional Long Term Goals Yes   PT LONG TERM GOAL #6   Title Pt will achieve 50 degr. ER to improve functional ROM   Baseline 30 deg.   Time 4   Period Weeks   Status Not Met               Plan -  10/24/15 1011    Clinical Impression Statement Pt has been seen by MD who wants her to continue with PT to continue to address ROM and muscle weakness. Currently shoulder flexion is 120 degrees, ER 15 degr., abduction 110 degr. primarily in scapular plane. Pt has difficulty tolerating manual tx and stretching, but verbalizes intent to perform all exercises to address impairments. Pt would benefit from further PT to address these issues.   Pt will benefit from skilled therapeutic intervention in order to improve on the following deficits Decreased activity tolerance;Decreased range of motion;Decreased strength;Impaired flexibility;Impaired UE functional use;Pain   Rehab Potential Good   PT Frequency 2x / week   PT Duration 6 weeks   PT Treatment/Interventions Ultrasound;Cryotherapy;Electrical Stimulation;Moist Heat;Therapeutic exercise;Therapeutic activities;Passive range of motion;Dry needling;Manual techniques;Patient/family education;Aquatic Therapy        Problem List Patient Active Problem List   Diagnosis Date Noted  . Routine general medical examination at a health care facility 02/22/2014  . Encounter for routine gynecological examination 02/22/2014    Fisher,Benjamin 10/24/2015, 11:15 AM  [place] [facility] [address] [place], [address] Phone: [phone]   Fax:  [phone]  Name: Shaaron Golliday MRN: [id number] Date of Birth: March 20, 1956

## 2015-10-29 ENCOUNTER — Ambulatory Visit: Payer: 59 | Admitting: Physical Therapy

## 2015-10-29 DIAGNOSIS — M6281 Muscle weakness (generalized): Secondary | ICD-10-CM

## 2015-10-29 DIAGNOSIS — R29898 Other symptoms and signs involving the musculoskeletal system: Secondary | ICD-10-CM | POA: Diagnosis not present

## 2015-10-29 DIAGNOSIS — M2560 Stiffness of unspecified joint, not elsewhere classified: Secondary | ICD-10-CM

## 2015-10-29 NOTE — Therapy (Signed)
[facility] [address], [place], [address] Phone: [phone]   Fax:  [phone]  Physical Therapy Treatment  Patient Details  Name: Amy Sims MRN: [id number] Date of Birth: 1956/04/08 No Data Recorded  Encounter Date: 10/29/2015      PT End of Session - 10/29/15 1506    Visit Number 30   Number of Visits 37   Date for PT Re-Evaluation 12/23/14   PT Start Time 1447   PT Stop Time 1530   PT Time Calculation (min) 43 min   Equipment Utilized During Treatment --  sling   Activity Tolerance Patient tolerated treatment well;Patient limited by pain  pain at end range   Behavior During Therapy [facility] for tasks assessed/performed      Past Medical History  Diagnosis Date  . Generalized headaches   . 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 Tooth extraction    . Rotator cuff repair Right     There were no vitals filed for this visit.  Visit Diagnosis:  Stiffness in joint  Muscle weakness      Subjective Assessment - 10/29/15 1453    Subjective Pt reports she has been doing her exercises, reports some incr. soreness from ER stretch with 5# wt.    Patient is accompained by: Family member   Patient Stated Goals full use of arm   Currently in Pain? No/denies   Pain Onset 1 to 4 weeks ago          Objective: Seated AP mobs for GH joint at neutral 5x1 min grade II. Following this pt reported decr. Pain with shoulder elevation.  Supine end range flexion GH joint mobs inferior, lateral traction 5x1 min.  Manual posterior tipping of scapula with elevation 3x20 with bent arms. Pt demonstrated decr. Shoulder shrug following this.  UE ranger with extensive education and manual cuing to avoid shoulder shrug. Flexion, arcing. 8 min total.  PROM for shoulder flexion, abduction, ER, IR.  Extensive education on performance of bent arm  AROM with self AAROM for shoulder flexion - able to perofrm pain free with self assistance and focus on decr. Shrug.                       PT Education - 10/29/15 1454    Education provided Yes   Education Details new HEP   Person(s) Educated Patient   Methods Explanation   Comprehension Verbalized understanding             PT Long Term Goals - 10/24/15 1018    PT LONG TERM GOAL #1   Title Patient will decrease Quick DASH score by > 8 points demonstrating reduced self-reported upper extremity disability    Baseline 36%   Time 12   Period Weeks   Status Achieved   PT LONG TERM GOAL #2   Title Patient will be independent with home exercise program for prevention/ self-management of shoulder symptoms/ difficulty    Time 12   Period Weeks   Status Achieved   PT LONG TERM GOAL #3   Title Pt to report 0/10 resting pain, 1-2/10 pain with self-stretching and strengthening program     Status Achieved   PT LONG TERM GOAL #4   Title pt will reach into over head cabinet to retrieve 2lb itm x 5 with minimal shoulder  compensation and pain <3/10   Time 12   Period Weeks   Status Partially Met   PT LONG TERM GOAL #5   Title pt will achieve flexion PROM to 140 deg, Abd to 150 deg, ER to 45 deg demonstrating functional ROM progress.    Baseline PROM flexion 125 deg, abduction 105 deg, ER 30 deg    Time 4   Period Weeks   Status Achieved   Additional Long Term Goals   Additional Long Term Goals Yes   PT LONG TERM GOAL #6   Title Pt will achieve 50 degr. ER to improve functional ROM   Baseline 30 deg.   Time 4   Period Weeks   Status Not Met               Plan - 10/29/15 1530    Clinical Impression Statement Pt has made improvement in ER to 25 degr., no change in flexion. pt continues to demonstrate significant shoulder shrug with elevatoin above 90 passive and active, indicating motor pattern difficulty. Foucs of session on modifying shoulder elevation  strategy to shoulder upward rotation strategy which pt struggled with and would benefit from further PT to address.   Pt will benefit from skilled therapeutic intervention in order to improve on the following deficits Decreased activity tolerance;Decreased range of motion;Decreased strength;Impaired flexibility;Impaired UE functional use;Pain   Rehab Potential Good   PT Frequency 2x / week   PT Duration 6 weeks   PT Treatment/Interventions Ultrasound;Cryotherapy;Electrical Stimulation;Moist Heat;Therapeutic exercise;Therapeutic activities;Passive range of motion;Dry needling;Manual techniques;Patient/family education;Aquatic Therapy        Problem List Patient Active Problem List   Diagnosis Date Noted  . Routine general medical examination at a health care facility 02/22/2014  . Encounter for routine gynecological examination 02/22/2014    Fisher,Benjamin PT 10/29/2015, 4:47 PM  [facility] [address], [place], [address] Phone: [phone]   Fax:  [phone]  Name: Maeci Kalbfleisch MRN: [id number] Date of Birth: 1956-11-09

## 2015-11-02 ENCOUNTER — Ambulatory Visit: Payer: 59 | Admitting: Physical Therapy

## 2015-11-02 DIAGNOSIS — R29898 Other symptoms and signs involving the musculoskeletal system: Secondary | ICD-10-CM

## 2015-11-02 DIAGNOSIS — M2560 Stiffness of unspecified joint, not elsewhere classified: Secondary | ICD-10-CM

## 2015-11-02 NOTE — Therapy (Signed)
[facility] [address] [place], [address] Phone: [phone]   Fax:  [phone]  Physical Therapy Treatment  Patient Details  Name: Amy Sims MRN: [id number] Date of Birth: 1956-10-22 No Data Recorded  Encounter Date: 11/02/2015      PT End of Session - 11/02/15 1128    Visit Number 31   Number of Visits 37   Date for PT Re-Evaluation 12/23/14   PT Start Time 1055   PT Stop Time 1135   PT Time Calculation (min) 40 min   Equipment Utilized During Treatment --  sling   Activity Tolerance Patient tolerated treatment well;Patient limited by pain  pain at end range   Behavior During Therapy [facility] for tasks assessed/performed      Past Medical History  Diagnosis Date  . Generalized headaches   . 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 Tooth extraction    . Rotator cuff repair Right     There were no vitals filed for this visit.  Visit Diagnosis:  Stiffness in joint  Weakness of shoulder      Subjective Assessment - 11/02/15 1056    Subjective Pt reports she has been doing her exercises, decr. soreneess.   Patient is accompained by: Family member   Patient Stated Goals full use of arm   Currently in Pain? No/denies   Pain Onset 1 to 4 weeks ago           Objective: Corrected pt performance of doorway stretch to bent arm to address humeral head position. Performed 3x1 min. Stretch.  Supine mobs for GH inferior glide in end range for flexion, abduction 3x1 min each.  Quadrant mob with sustained holds 3x45 sec.  Following mobs pt reported incr. Pain to 2/10, improved shoulder flexion without shrug sign to 120.  SL 2# wt ER lift with manual overpressure for improved form 3x15 with 3 sec. Holds at end range. Pt extremely fatigued following this but no pain.  Manually assisted ER with scapular retraction 3x30  mobs.  Following sessio npt reported decr. Pain with shoulder elevation, demosntrated decr. Shoulder shrug in abduction .                      PT Education - 11/02/15 1103    Education provided Yes   Education Details SL shoulder elevation to address shrug   Person(s) Educated Patient   Methods Explanation   Comprehension Verbalized understanding             PT Long Term Goals - 10/24/15 1018    PT LONG TERM GOAL #1   Title Patient will decrease Quick DASH score by > 8 points demonstrating reduced self-reported upper extremity disability    Baseline 36%   Time 12   Period Weeks   Status Achieved   PT LONG TERM GOAL #2   Title Patient will be independent with home exercise program for prevention/ self-management of shoulder symptoms/ difficulty    Time 12   Period Weeks   Status Achieved   PT LONG TERM GOAL #3   Title Pt to report 0/10 resting pain, 1-2/10 pain with self-stretching and strengthening program     Status Achieved   PT LONG TERM GOAL #4   Title pt will reach into over head cabinet to retrieve 2lb itm x 5 with minimal  shoulder compensation and pain <3/10   Time 12   Period Weeks   Status Partially Met   PT LONG TERM GOAL #5   Title pt will achieve flexion PROM to 140 deg, Abd to 150 deg, ER to 45 deg demonstrating functional ROM progress.    Baseline PROM flexion 125 deg, abduction 105 deg, ER 30 deg    Time 4   Period Weeks   Status Achieved   Additional Long Term Goals   Additional Long Term Goals Yes   PT LONG TERM GOAL #6   Title Pt will achieve 50 degr. ER to improve functional ROM   Baseline 30 deg.   Time 4   Period Weeks   Status Not Met               Plan - 11/02/15 1129    Clinical Impression Statement Pt demonstrates anterior relocation of humeral head with ER stretch, ROM is still limited but pt is able to achieve 95 degr. shoulder flexion prior to shrug sign. Continues to demonstrate global shoulder weakness and  impaired motor pattern which appears to be improving with PT.   Pt will benefit from skilled therapeutic intervention in order to improve on the following deficits Decreased activity tolerance;Decreased range of motion;Decreased strength;Impaired flexibility;Impaired UE functional use;Pain   Rehab Potential Good   PT Frequency 2x / week   PT Duration 6 weeks   PT Treatment/Interventions Ultrasound;Cryotherapy;Electrical Stimulation;Moist Heat;Therapeutic exercise;Therapeutic activities;Passive range of motion;Dry needling;Manual techniques;Patient/family education;Aquatic Therapy        Problem List Patient Active Problem List   Diagnosis Date Noted  . Routine general medical examination at a health care facility 02/22/2014  . Encounter for routine gynecological examination 02/22/2014    Fisher,Benjamin 11/02/2015, 11:47 AM  [place] [facility] [address], [place], [address] Phone: [phone]   Fax:  [phone]  Name: Amy Sims MRN: [id number] Date of Birth: 1956/05/19

## 2015-11-05 ENCOUNTER — Encounter: Payer: 59 | Admitting: Physical Therapy

## 2015-11-06 ENCOUNTER — Ambulatory Visit: Payer: 59 | Admitting: Physical Therapy

## 2015-11-06 DIAGNOSIS — R29898 Other symptoms and signs involving the musculoskeletal system: Secondary | ICD-10-CM

## 2015-11-06 DIAGNOSIS — M2560 Stiffness of unspecified joint, not elsewhere classified: Secondary | ICD-10-CM

## 2015-11-06 NOTE — Therapy (Signed)
[facility] [address], [place], [address] Phone: [phone]   Fax:  [phone]  Physical Therapy Treatment  Patient Details  Name: Amy Sims MRN: [id number] Date of Birth: 28-Dec-1955 No Data Recorded  Encounter Date: 11/06/2015      PT End of Session - 11/06/15 1057    Visit Number 32   Number of Visits 37   Date for PT Re-Evaluation 12/23/14   PT Start Time 1054   PT Stop Time 1128   PT Time Calculation (min) 34 min   Equipment Utilized During Treatment --  sling   Activity Tolerance Patient tolerated treatment well;Patient limited by pain  pain at end range   Behavior During Therapy [facility] for tasks assessed/performed      Past Medical History  Diagnosis Date  . Generalized headaches   . 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 Tooth extraction    . Rotator cuff repair Right     There were no vitals filed for this visit.  Visit Diagnosis:  Stiffness in joint  Weakness of shoulder      Subjective Assessment - 11/06/15 1056    Subjective Pt reports she went to [place] over the weekend. Has been doing her exercises and reports incr. soreness.   Patient is accompained by: Family member   Patient Stated Goals full use of arm   Currently in Pain? No/denies   Pain Score 0-No pain   Pain Onset 1 to 4 weeks ago          Objective:   PROM for shoulder flexion, abduction, ER.  [place] joint mobs 3x30 oscillations grade IV at end range flex, abdcution. Following this pt reported no change in symptoms and noted continued anterior relocation of humeral head.  Focus of rest of session on two exercises to address poor control at 90 degr. Elevation:  Slides on wall with and without YTB loop for help with maintaining neutral [place] joint position. Pt able to do so minimally with help of YTB and extensive manual cuing.  Press up  position on table cuing to achieve self posterior glide of humeral head, extensive cuing for performance to avoid elbow flexion.  Following this pt reported decr. Pain with overhead activity and shrug sign noted at 95 rather than85 degr.                            PT Long Term Goals - 10/24/15 1018    PT LONG TERM GOAL #1   Title Patient will decrease Quick DASH score by > 8 points demonstrating reduced self-reported upper extremity disability    Baseline 36%   Time 12   Period Weeks   Status Achieved   PT LONG TERM GOAL #2   Title Patient will be independent with home exercise program for prevention/ self-management of shoulder symptoms/ difficulty    Time 12   Period Weeks   Status Achieved   PT LONG TERM GOAL #3   Title Pt to report 0/10 resting pain, 1-2/10 pain with self-stretching and strengthening program     Status Achieved   PT LONG TERM GOAL #4   Title pt will reach into over head cabinet to retrieve 2lb itm x 5 with minimal shoulder compensation and pain <3/10   Time 12   Period Weeks  Status Partially Met   PT LONG TERM GOAL #5   Title pt will achieve flexion PROM to 140 deg, Abd to 150 deg, ER to 45 deg demonstrating functional ROM progress.    Baseline PROM flexion 125 deg, abduction 105 deg, ER 30 deg    Time 4   Period Weeks   Status Achieved   Additional Long Term Goals   Additional Long Term Goals Yes   PT LONG TERM GOAL #6   Title Pt will achieve 50 degr. ER to improve functional ROM   Baseline 30 deg.   Time 4   Period Weeks   Status Not Met               Plan - 11/06/15 1128    Clinical Impression Statement Pt made significant improvement within session.in terms of ability to maintain centrated GH joint position. Able to avoid shrug until 95 degr. flexion with cuing. Issued new HEP which pt is able to perform well with decr. shoulder shrug.   Pt will benefit from skilled therapeutic intervention in order to improve on the  following deficits Decreased activity tolerance;Decreased range of motion;Decreased strength;Impaired flexibility;Impaired UE functional use;Pain   Rehab Potential Good   PT Frequency 2x / week   PT Duration 6 weeks   PT Treatment/Interventions Ultrasound;Cryotherapy;Electrical Stimulation;Moist Heat;Therapeutic exercise;Therapeutic activities;Passive range of motion;Dry needling;Manual techniques;Patient/family education;Aquatic Therapy        Problem List Patient Active Problem List   Diagnosis Date Noted  . Routine general medical examination at a health care facility 02/22/2014  . Encounter for routine gynecological examination 02/22/2014    Fisher,Benjamin PT 11/06/2015, 12:39 PM  [facility] [address] [place], [place], [address] Phone: [phone]   Fax:  [phone]  Name: Amy Sims MRN: [id number] Date of Birth: 06/21/1956     

## 2015-11-14 ENCOUNTER — Ambulatory Visit: Payer: 59 | Admitting: Physical Therapy

## 2015-11-14 DIAGNOSIS — M2560 Stiffness of unspecified joint, not elsewhere classified: Secondary | ICD-10-CM

## 2015-11-14 DIAGNOSIS — R29898 Other symptoms and signs involving the musculoskeletal system: Secondary | ICD-10-CM

## 2015-11-14 NOTE — Therapy (Signed)
[facility] [address] [place], [address] Phone: [phone]   Fax:  [phone]  Physical Therapy Treatment  Patient Details  Name: Amy Sims MRN: [id number] Date of Birth: 1956-04-17 No Data Recorded  Encounter Date: 11/14/2015      PT End of Session - 11/14/15 1044    Visit Number 33   Number of Visits 37   Date for PT Re-Evaluation 12/23/14   PT Start Time 1028   PT Stop Time 1110   PT Time Calculation (min) 42 min   Equipment Utilized During Treatment --  sling   Activity Tolerance Patient tolerated treatment well;Patient limited by pain  pain at end range   Behavior During Therapy [facility] for tasks assessed/performed      Past Medical History  Diagnosis Date  . Generalized headaches   . 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 Tooth extraction    . Rotator cuff repair Right     There were no vitals filed for this visit.  Visit Diagnosis:  Weakness of shoulder  Stiffness in joint      Subjective Assessment - 11/14/15 1033    Subjective Pt reports she is doing better. Some soreness in arm but she is now able to reach her phone across her car which she has not been able to do in the past.   Patient is accompained by: Family member   Patient Stated Goals full use of arm   Currently in Pain? No/denies   Pain Score 0-No pain   Pain Onset 1 to 4 weeks ago           Objective: C-R for ER at 90 degr. Abduction 3x10 with 5 sec. Holds. ROM incr. By 15 degr. To 50.  Quadrant mob 3x30 oscillations grade III - no change in ROM but decr. Pain with end range abduction/ER.  AP mobs 3x1 min grade IV for GH joint - following this pt reported decr. Pain with shoulder flexion.   2# wt front raise, extension, abduction 3x15 each with extensive cuing for appropriate muscle activation and avoiding shrug. Able to perform to 35  degr. Flexion and abduction, 40 degr. Extension.  Pt c/o pain at end range possibly due to extensive manual treatment.                      PT Education - 11/14/15 1033    Education provided Yes   Education Details Sustained stretches for IR   Person(s) Educated Patient   Methods Explanation   Comprehension Verbalized understanding             PT Long Term Goals - 10/24/15 1018    PT LONG TERM GOAL #1   Title Patient will decrease Quick DASH score by > 8 points demonstrating reduced self-reported upper extremity disability    Baseline 36%   Time 12   Period Weeks   Status Achieved   PT LONG TERM GOAL #2   Title Patient will be independent with home exercise program for prevention/ self-management of shoulder symptoms/ difficulty    Time 12   Period Weeks   Status Achieved   PT LONG TERM GOAL #3   Title Pt to report 0/10 resting pain, 1-2/10 pain with self-stretching and strengthening program     Status Achieved   PT LONG TERM GOAL #4  Title pt will reach into over head cabinet to retrieve 2lb itm x 5 with minimal shoulder compensation and pain <3/10   Time 12   Period Weeks   Status Partially Met   PT LONG TERM GOAL #5   Title pt will achieve flexion PROM to 140 deg, Abd to 150 deg, ER to 45 deg demonstrating functional ROM progress.    Baseline PROM flexion 125 deg, abduction 105 deg, ER 30 deg    Time 4   Period Weeks   Status Achieved   Additional Long Term Goals   Additional Long Term Goals Yes   PT LONG TERM GOAL #6   Title Pt will achieve 50 degr. ER to improve functional ROM   Baseline 30 deg.   Time 4   Period Weeks   Status Not Met               Plan - 11/14/15 1153    Clinical Impression Statement shrug at 80 degr. at the start of session, improved to 93 at end of session for elevation. significant muscle weakness in deltoid, particularly anterior and middle deltoid.    Pt will benefit from skilled therapeutic intervention  in order to improve on the following deficits Decreased activity tolerance;Decreased range of motion;Decreased strength;Impaired flexibility;Impaired UE functional use;Pain   Rehab Potential Good   PT Frequency 2x / week   PT Duration 6 weeks   PT Treatment/Interventions Ultrasound;Cryotherapy;Electrical Stimulation;Moist Heat;Therapeutic exercise;Therapeutic activities;Passive range of motion;Dry needling;Manual techniques;Patient/family education;Aquatic Therapy        Problem List Patient Active Problem List   Diagnosis Date Noted  . Routine general medical examination at a health care facility 02/22/2014  . Encounter for routine gynecological examination 02/22/2014    Jamichael Knotts PT 11/14/2015, 11:57 AM  [place] [facility] [address], [place], [address] Phone: [phone]   Fax:  [phone]  Name: Verne Cove MRN: [id number] Date of Birth: December 19, 1955

## 2015-11-16 ENCOUNTER — Ambulatory Visit: Payer: 59 | Admitting: Physical Therapy

## 2015-11-19 ENCOUNTER — Ambulatory Visit: Payer: 59 | Admitting: Physical Therapy

## 2015-11-22 ENCOUNTER — Ambulatory Visit: Payer: 59 | Admitting: Physical Therapy

## 2015-11-22 ENCOUNTER — Ambulatory Visit: Payer: 59 | Attending: Orthopaedic Surgery

## 2015-11-22 DIAGNOSIS — M6281 Muscle weakness (generalized): Secondary | ICD-10-CM | POA: Insufficient documentation

## 2015-11-22 DIAGNOSIS — M2560 Stiffness of unspecified joint, not elsewhere classified: Secondary | ICD-10-CM | POA: Diagnosis present

## 2015-11-22 DIAGNOSIS — R29898 Other symptoms and signs involving the musculoskeletal system: Secondary | ICD-10-CM | POA: Insufficient documentation

## 2015-11-22 NOTE — Therapy (Signed)
[facility] [address] [place], [address] Phone: [phone]   Fax:  [phone]  Physical Therapy Treatment  Patient Details  Name: Amy Sims MRN: [id number] Date of Birth: 05-19-1956 No Data Recorded  Encounter Date: 11/22/2015      PT End of Session - 11/22/15 1054    Visit Number 34   Number of Visits 37   Date for PT Re-Evaluation 12/23/14   PT Start Time 1054   PT Stop Time 1147   PT Time Calculation (min) 53 min   Equipment Utilized During Treatment --  sling   Activity Tolerance Patient tolerated treatment well;Patient limited by pain  pain at end range   Behavior During Therapy [facility] for tasks assessed/performed      Past Medical History  Diagnosis Date  . Generalized headaches   . 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 Tooth extraction    . Rotator cuff repair Right     There were no vitals filed for this visit.  Visit Diagnosis:  Weakness of shoulder  Stiffness in joint  Muscle weakness      Subjective Assessment - 11/22/15 1056    Subjective Pt states R shoulder is doing better. Sore from doing shoulder ER stretch  with the 5 lb weight.    Patient is accompained by: Family member   Patient Stated Goals full use of arm   Currently in Pain? No/denies   Pain Onset 1 to 4 weeks ago          Objective:  Manual therapy:  R shoulder in about 90 degrees abduction, as well as 90 degrees scaption: posterior, inferior, and posterior/inferior glide grade 3 Supine R shoulder flexion AAROM to end range 10x2 Soft tissue mobilization to R terres major (tight)    There-ex Directed patient with supine R shoulder ER stretch hand  behind head 30 seconds x3 Supine bilateral shoulder flexion with yellow band resisting ER 5x   Then in the reclined (35 degrees) position (for more gravitational resistance) 2x5 (able  to achieve 148 degrees R shoulder flexion AAROM)    Then in 45 degrees in reclined position 5x Supine manually resisted R shoulder flexion 10x  Improved exercise technique, movement at target joints, use of target muscles after mod verbal, visual, tactile cues.    Tight R terres major muscle, stiffness R glenohumeral joint. Some difficulty utilizing infraspinatus muscle when raising R arm. 148 degrees R shoulder flexion AAROM.                        PT Education - 11/22/15 1950    Education provided Yes   Education Details ther-ex   [employer]) Educated Patient   Methods Explanation;Demonstration;Tactile cues;Verbal cues   Comprehension Verbalized understanding;Returned demonstration             PT Long Term Goals - 10/24/15 1018    PT LONG TERM GOAL #1   Title Patient will decrease Quick DASH score by > 8 points demonstrating reduced self-reported upper extremity disability    Baseline 36%   Time 12   Period Weeks   Status Achieved   PT LONG TERM GOAL #2   Title Patient will be independent with home exercise program for prevention/ self-management of shoulder symptoms/ difficulty    Time 12   Period Weeks   Status  Achieved   PT LONG TERM GOAL #3   Title Pt to report 0/10 resting pain, 1-2/10 pain with self-stretching and strengthening program     Status Achieved   PT LONG TERM GOAL #4   Title pt will reach into over head cabinet to retrieve 2lb itm x 5 with minimal shoulder compensation and pain <3/10   Time 12   Period Weeks   Status Partially Met   PT LONG TERM GOAL #5   Title pt will achieve flexion PROM to 140 deg, Abd to 150 deg, ER to 45 deg demonstrating functional ROM progress.    Baseline PROM flexion 125 deg, abduction 105 deg, ER 30 deg    Time 4   Period Weeks   Status Achieved   Additional Long Term Goals   Additional Long Term Goals Yes   PT LONG TERM GOAL #6   Title Pt will achieve 50 degr. ER to improve functional ROM   Baseline  30 deg.   Time 4   Period Weeks   Status Not Met               Plan - 11/22/15 1054    Clinical Impression Statement Tight R terres major muscle, stiffness R glenohumeral joint. Some difficulty utilizing infraspinatus muscle when raising R arm. 148 degrees R shoulder flexion AAROM.   Pt will benefit from skilled therapeutic intervention in order to improve on the following deficits Decreased activity tolerance;Decreased range of motion;Decreased strength;Impaired flexibility;Impaired UE functional use;Pain   Rehab Potential Good   PT Frequency 2x / week   PT Duration 6 weeks   PT Treatment/Interventions Ultrasound;Cryotherapy;Electrical Stimulation;Moist Heat;Therapeutic exercise;Therapeutic activities;Passive range of motion;Dry needling;Manual techniques;Patient/family education;Aquatic Therapy        Problem List Patient Active Problem List   Diagnosis Date Noted  . Routine general medical examination at a health care facility 02/22/2014  . Encounter for routine gynecological examination 02/22/2014    Joneen Boers PT, DPT   11/22/2015, 8:12 PM  [facility] [address] [place], [address] Phone: [phone]   Fax:  [phone]  Name: Amy Sims MRN: [id number] Date of Birth: Feb 22, 1956

## 2015-11-26 ENCOUNTER — Ambulatory Visit: Payer: 59 | Admitting: Physical Therapy

## 2015-11-26 DIAGNOSIS — R29898 Other symptoms and signs involving the musculoskeletal system: Secondary | ICD-10-CM | POA: Diagnosis not present

## 2015-11-26 NOTE — Therapy (Signed)
[facility] [address], [place], [address] Phone: [phone]   Fax:  [phone]  Physical Therapy Treatment  Patient Details  Name: Amy Sims MRN: [id number] Date of Birth: [birth year] No Data Recorded  Encounter Date: 11/26/2015      PT End of Session - 11/26/15 1633    Visit Number 35   Number of Visits 37   Date for PT Re-Evaluation 12/23/14   PT Start Time 1600   PT Stop Time 1640   PT Time Calculation (min) 40 min   Equipment Utilized During Treatment --  sling   Activity Tolerance Patient tolerated treatment well;Patient limited by pain  pain at end range   Behavior During Therapy [facility] for tasks assessed/performed      Past Medical History  Diagnosis Date  . Generalized headaches   . 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 Tooth extraction    . Rotator cuff repair Right     There were no vitals filed for this visit.  Visit Diagnosis:  Weakness of shoulder      Subjective Assessment - 11/26/15 1628    Subjective Pt reports no change in symptoms since previous session.    Patient is accompained by: Family member   Patient Stated Goals full use of arm   Currently in Pain? No/denies   Pain Score 0-No pain   Pain Onset 1 to 4 weeks ago        Objective: Stretching supine into ER, IR, shoulder flexion with 3x30 oscillations and 3x1 min holds.  Incr. Pain to 3/10 with this, rest break to return pain to 0/10.  OMEGA 20# scapular retraction 3x20. OMEGA shoulder press 3x10# B, 3x5 with only R UE.  Pt extremely fatigued, requiring extended rest breaks to perform. Manual cuing to minimize shoulder shrug and to activate lat with this.  Supine ER stretch in 40 deg. Abduction.  Pain at end of session 2/10.                              PT Long Term Goals - 10/24/15 1018    PT LONG TERM GOAL  #1   Title Patient will decrease Quick DASH score by > 8 points demonstrating reduced self-reported upper extremity disability    Baseline 36%   Time 12   Period Weeks   Status Achieved   PT LONG TERM GOAL #2   Title Patient will be independent with home exercise program for prevention/ self-management of shoulder symptoms/ difficulty    Time 12   Period Weeks   Status Achieved   PT LONG TERM GOAL #3   Title Pt to report 0/10 resting pain, 1-2/10 pain with self-stretching and strengthening program     Status Achieved   PT LONG TERM GOAL #4   Title pt will reach into over head cabinet to retrieve 2lb itm x 5 with minimal shoulder compensation and pain <3/10   Time 12   Period Weeks   Status Partially Met   PT LONG TERM GOAL #5   Title pt will achieve flexion PROM to 140 deg, Abd to 150 deg, ER to 45 deg demonstrating functional ROM progress.    Baseline PROM flexion 125 deg, abduction 105 deg, ER 30 deg    Time 4   Period Weeks  Status Achieved   Additional Long Term Goals   Additional Long Term Goals Yes   PT LONG TERM GOAL #6   Title Pt will achieve 50 degr. ER to improve functional ROM   Baseline 30 deg.   Time 4   Period Weeks   Status Not Met               Plan - 11/26/15 1635    Clinical Impression Statement Continues to have tightness in shoulder, significant  weakness/difficulty when raising arm with any weight.   Pt will benefit from skilled therapeutic intervention in order to improve on the following deficits Decreased activity tolerance;Decreased range of motion;Decreased strength;Impaired flexibility;Impaired UE functional use;Pain   Rehab Potential Good   PT Frequency 2x / week   PT Duration 6 weeks   PT Treatment/Interventions Ultrasound;Cryotherapy;Electrical Stimulation;Moist Heat;Therapeutic exercise;Therapeutic activities;Passive range of motion;Dry needling;Manual techniques;Patient/family education;Aquatic Therapy        Problem  List Patient Active Problem List   Diagnosis Date Noted  . Routine general medical examination at a health care facility 02/22/2014  . Encounter for routine gynecological examination 02/22/2014    Fisher,Benjamin PT DPT 11/26/2015, 5:29 PM  [facility] [address] [place], [place], [address] Phone: [phone]   Fax:  [phone]  Name: Caileigh Boldman MRN: [id number] Date of Birth: 11/20/1956     

## 2015-11-29 ENCOUNTER — Ambulatory Visit: Payer: 59 | Admitting: Physical Therapy

## 2015-11-29 DIAGNOSIS — M2560 Stiffness of unspecified joint, not elsewhere classified: Secondary | ICD-10-CM

## 2015-11-29 DIAGNOSIS — R29898 Other symptoms and signs involving the musculoskeletal system: Secondary | ICD-10-CM

## 2015-11-29 NOTE — Therapy (Signed)
[facility] [address] [place], [address] Phone: [phone]   Fax:  [phone]  Physical Therapy Treatment  Patient Details  Name: Amy Sims MRN: [id number] Date of Birth: [birth year] No Data Recorded  Encounter Date: 11/29/2015      PT End of Session - 11/29/15 1215    Visit Number 36   Number of Visits 37   Date for PT Re-Evaluation 12/23/14   PT Start Time 1100   PT Stop Time 1145   PT Time Calculation (min) 45 min   Equipment Utilized During Treatment --  sling   Activity Tolerance Patient tolerated treatment well  pain at end range   Behavior During Therapy [facility] for tasks assessed/performed      Past Medical History  Diagnosis Date  . Generalized headaches   . 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 Tooth extraction    . Rotator cuff repair Right     There were no vitals filed for this visit.  Visit Diagnosis:  Stiffness in joint  Weakness of shoulder      Subjective Assessment - 11/29/15 1056    Subjective Pt reports she is doing significantly better today, improved ROM.   Patient is accompained by: Family member   Patient Stated Goals full use of arm   Currently in Pain? No/denies   Pain Score 0-No pain   Pain Onset 1 to 4 weeks ago          Objective: Aggressive stretching 3x1 min flexion, abduction, ER at end range abduction.  Inferior glides performed on abduction 90 degr. Following this ROM improved from 85 to 110 degr. PROM, AROM from 80 to 105.  Bicep curl to shoulder press with 8# wt 3x10 to improve shoulder control with elevation.  Abduction with 2# wt 3x10 bent arm requiring extensive cuing.  Seated abduction stetch with cuing to depress shoulder blade. 3x1 min.  Shoulder flexion propped stretch with basketball for elbow flexion/abduction. 3x20  oscillations.                       PT Education - 11/29/15 1056    Education provided Yes   Education Details HEP   Person(s) Educated Patient   Methods Explanation   Comprehension Verbalized understanding             PT Long Term Goals - 10/24/15 1018    PT LONG TERM GOAL #1   Title Patient will decrease Quick DASH score by > 8 points demonstrating reduced self-reported upper extremity disability    Baseline 36%   Time 12   Period Weeks   Status Achieved   PT LONG TERM GOAL #2   Title Patient will be independent with home exercise program for prevention/ self-management of shoulder symptoms/ difficulty    Time 12   Period Weeks   Status Achieved   PT LONG TERM GOAL #3   Title Pt to report 0/10 resting pain, 1-2/10 pain with self-stretching and strengthening program     Status Achieved   PT LONG TERM GOAL #4   Title pt will reach into over head cabinet to retrieve 2lb itm x 5 with minimal shoulder compensation and pain <3/10   Time 12   Period Weeks   Status Partially Met   PT LONG TERM GOAL #5   Title pt will  achieve flexion PROM to 140 deg, Abd to 150 deg, ER to 45 deg demonstrating functional ROM progress.    Baseline PROM flexion 125 deg, abduction 105 deg, ER 30 deg    Time 4   Period Weeks   Status Achieved   Additional Long Term Goals   Additional Long Term Goals Yes   PT LONG TERM GOAL #6   Title Pt will achieve 50 degr. ER to improve functional ROM   Baseline 30 deg.   Time 4   Period Weeks   Status Not Met               Plan - 11/29/15 1216    Clinical Impression Statement improved ROM from previous session with significant decr. in shoulder shrug. Pt is continuing to work on strengthening arm above 90 degr. shoulder flexion has returned but abduction still limited at 100 degr.   Pt will benefit from skilled therapeutic intervention in order to improve on the following deficits Decreased activity tolerance;Decreased range of  motion;Decreased strength;Impaired flexibility;Impaired UE functional use;Pain   Rehab Potential Good   PT Frequency 2x / week   PT Duration 6 weeks   PT Treatment/Interventions Ultrasound;Cryotherapy;Electrical Stimulation;Moist Heat;Therapeutic exercise;Therapeutic activities;Passive range of motion;Dry needling;Manual techniques;Patient/family education;Aquatic Therapy        Problem List Patient Active Problem List   Diagnosis Date Noted  . Routine general medical examination at a health care facility 02/22/2014  . Encounter for routine gynecological examination 02/22/2014    Fisher,Benjamin PT DPT 11/29/2015, 12:21 PM  [facility] [address], [place], [address] Phone: [phone]   Fax:  [phone]  Name: Amy Sims MRN: [id number] Date of Birth: June 17, 1956

## 2015-12-03 ENCOUNTER — Ambulatory Visit: Payer: 59 | Admitting: Physical Therapy

## 2015-12-03 DIAGNOSIS — R29898 Other symptoms and signs involving the musculoskeletal system: Secondary | ICD-10-CM | POA: Diagnosis not present

## 2015-12-03 DIAGNOSIS — M2560 Stiffness of unspecified joint, not elsewhere classified: Secondary | ICD-10-CM

## 2015-12-03 DIAGNOSIS — M6281 Muscle weakness (generalized): Secondary | ICD-10-CM

## 2015-12-03 NOTE — Therapy (Signed)
[facility] [address], [place], [address] Phone: [phone]   Fax:  [phone]  Physical Therapy Treatment  Patient Details  Name: Amy Sims MRN: [id number] Date of Birth: Sep 15, 1956 No Data Recorded  Encounter Date: 12/03/2015      PT End of Session - 12/03/15 1108    Visit Number 37   Number of Visits 37   Date for PT Re-Evaluation 12/23/14   PT Start Time 1102   PT Stop Time 1145   PT Time Calculation (min) 43 min   Equipment Utilized During Treatment --  sling   Activity Tolerance Patient tolerated treatment well  pain at end range   Behavior During Therapy [facility] for tasks assessed/performed      Past Medical History  Diagnosis Date  . Generalized headaches   . 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 Tooth extraction    . Rotator cuff repair Right     There were no vitals filed for this visit.  Visit Diagnosis:  Stiffness in joint  Muscle weakness      Subjective Assessment - 12/03/15 1107    Subjective Pt reports she saw her MD who is very pleased with her response and improvement in ROM.   Patient is accompained by: Family member   Patient Stated Goals full use of arm   Currently in Pain? No/denies   Pain Score 0-No pain   Pain Onset 1 to 4 weeks ago            Objective: Prone GH PA mobs 5x1 min at 90 degr. Abduction.   Prone quadrant mobs 5x1 min with stabilization of scapula.  Prone CPAs grade II 2x45 sec. T2-T5.   Sustained SL lat stretch with one hand on elbow, one on scapula to depress scap, 5x1 min.  MWM for ER performed with pt holding arm at end range abd. Performed with cuing for pt to perform active ER with minimal thoracic extension.  Following manual intervention abduction improved from 90 to 115 abduction.                          PT Long Term Goals -  10/24/15 1018    PT LONG TERM GOAL #1   Title Patient will decrease Quick DASH score by > 8 points demonstrating reduced self-reported upper extremity disability    Baseline 36%   Time 12   Period Weeks   Status Achieved   PT LONG TERM GOAL #2   Title Patient will be independent with home exercise program for prevention/ self-management of shoulder symptoms/ difficulty    Time 12   Period Weeks   Status Achieved   PT LONG TERM GOAL #3   Title Pt to report 0/10 resting pain, 1-2/10 pain with self-stretching and strengthening program     Status Achieved   PT LONG TERM GOAL #4   Title pt will reach into over head cabinet to retrieve 2lb itm x 5 with minimal shoulder compensation and pain <3/10   Time 12   Period Weeks   Status Partially Met   PT LONG TERM GOAL #5   Title pt will achieve flexion PROM to 140 deg, Abd to 150 deg, ER to 45 deg demonstrating functional ROM progress.    Baseline PROM flexion 125 deg, abduction 105 deg, ER 30  deg    Time 4   Period Weeks   Status Achieved   Additional Long Term Goals   Additional Long Term Goals Yes   PT LONG TERM GOAL #6   Title Pt will achieve 50 degr. ER to improve functional ROM   Baseline 30 deg.   Time 4   Period Weeks   Status Not Met               Plan - 12/03/15 1116    Clinical Impression Statement pt responded very well to manual lat stretch and joint mobs. Would benefit from focus on strengthening above 90 at next session to address continued weakness in functional ranges. Abduction to 110 degr.   Pt will benefit from skilled therapeutic intervention in order to improve on the following deficits Decreased activity tolerance;Decreased range of motion;Decreased strength;Impaired flexibility;Impaired UE functional use;Pain   Rehab Potential Good   PT Frequency 2x / week   PT Duration 6 weeks   PT Treatment/Interventions Ultrasound;Cryotherapy;Electrical Stimulation;Moist Heat;Therapeutic exercise;Therapeutic  activities;Passive range of motion;Dry needling;Manual techniques;Patient/family education;Aquatic Therapy        Problem List Patient Active Problem List   Diagnosis Date Noted  . Routine general medical examination at a health care facility 02/22/2014  . Encounter for routine gynecological examination 02/22/2014    Candy Leverett PT DPT 12/03/2015, 11:56 AM  [facility] [address], [place], [address] Phone: [phone]   Fax:  [phone]  Name: Amy Sims MRN: [id number] Date of Birth: 18-Jun-1956

## 2015-12-06 ENCOUNTER — Ambulatory Visit: Payer: 59 | Admitting: Physical Therapy

## 2015-12-06 DIAGNOSIS — R29898 Other symptoms and signs involving the musculoskeletal system: Secondary | ICD-10-CM

## 2015-12-06 NOTE — Therapy (Signed)
[facility] [address], [place], [address] Phone: [phone]   Fax:  [phone]  Physical Therapy Treatment  Patient Details  Name: Amy Sims MRN: [id number] Date of Birth: 03-23-1956 No Data Recorded  Encounter Date: 12/06/2015      PT End of Session - 12/06/15 1251    Visit Number 38   Number of Visits 45   Date for PT Re-Evaluation 01/03/16   PT Start Time 1102   PT Stop Time 1150   PT Time Calculation (min) 48 min   Activity Tolerance Patient tolerated treatment well   Behavior During Therapy [facility] for tasks assessed/performed      Past Medical History  Diagnosis Date  . Generalized headaches   . 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 Tooth extraction    . Rotator cuff repair Right     There were no vitals filed for this visit.  Visit Diagnosis:  Weakness of shoulder - Plan: PT plan of care cert/re-cert      Subjective Assessment - 12/06/15 1250    Subjective Patient reports she has been compliant with her HEP and wants to return to work as able. She asks several questions regarding progression and how to work on her current deficits.    Patient is accompained by: Family member   Patient Stated Goals full use of arm   Currently in Pain? No/denies        ER measured at 39 degrees   Manual latissimus stretching with shoulder flexed, scapula stabilized and PNF contract-relax pattern performed 3 x 1 minute (no increase in symptoms)  Inferior glides grade II well tolerated, at varying degrees of abduction, ER MWM x 4 minutes total to end range at varying degrees of abduction  Sidelying shoulder flexion with bodyweight, 2# DB x 10 repetitions of each (Able to complete ~ [facility] Low AROM in supine, eliminated gravity with sideylying, able to complete ~ 120-130 degrees actively.)   UE ranger with horizontal rotations at highest  range of flexion x 20 repetitions (noted fatigue)   100 degrees of abduction, 108 degrees shoulder flexion   Push ups to elevated surface with cuing for slight ER and to "bend through her elbows" to allow increased scapular protraction/retraction 2 sets x 8 repetitions. Patient reported this was more challenging, no increase in pain.   Seated shoulder press through available ROM ~ 80-90 degrees of flexion with 1#, then 2# DB to increase serratus recruitment.   Prone horizontal abductions x 10 repetitions with very minimal assistance (no increase in pain, though very challenging for patient)                          PT Education - 12/06/15 1413    Education provided Yes   Education Details Progression of HEP to target serratus anterior and lower/middle trapezius for force couple improvement.    Person(s) Educated Patient   Methods Explanation;Demonstration;Handout   Comprehension Verbalized understanding;Returned demonstration             PT Long Term Goals - 12/06/15 1122    PT LONG TERM GOAL #1   Title Patient will decrease Quick DASH score by > 8 points demonstrating reduced self-reported upper extremity disability    Baseline 36%   Time 12   Period Weeks   Status Achieved  PT LONG TERM GOAL #2   Title Patient will be independent with home exercise program for prevention/ self-management of shoulder symptoms/ difficulty    Time 12   Period Weeks   Status Achieved   PT LONG TERM GOAL #3   Title Pt to report 0/10 resting pain, 1-2/10 pain with self-stretching and strengthening program     Status Achieved   PT LONG TERM GOAL #4   Title pt will reach into over head cabinet to retrieve 2lb itm x 5 with minimal shoulder compensation and pain <3/10   Baseline Able    Time 12   Period Weeks   Status Partially Met   PT LONG TERM GOAL #5   Title pt will achieve flexion PROM to 140 deg, Abd to 150 deg, ER to 45 deg demonstrating functional ROM progress.     Baseline PROM flexion 125 deg, abduction 105 deg, ER 30 deg    Time 4   Period Weeks   Status Achieved   Additional Long Term Goals   Additional Long Term Goals Yes   PT LONG TERM GOAL #6   Title Pt will achieve 50 degr. ER to improve functional ROM   Baseline 39 degrees on 12/06/2015   Time 4   Period Weeks   Status Not Met               Plan - 12/06/15 1414    Clinical Impression Statement Patient demonstrated significant weakness in lower trapezius and serratus anterior (fatigue and high RPE for incline push ups and prone horizontal abduction). Patient fatigued quickly with UE ranger rotations above 90 degrees as well. Patient would continue to benefit from overhead strengthening with focus on LT and serratus.    Pt will benefit from skilled therapeutic intervention in order to improve on the following deficits Decreased activity tolerance;Decreased range of motion;Decreased strength;Impaired flexibility;Impaired UE functional use;Pain   Rehab Potential Good   PT Frequency 2x / week   PT Duration 4 weeks   PT Treatment/Interventions Ultrasound;Cryotherapy;Electrical Stimulation;Moist Heat;Therapeutic exercise;Therapeutic activities;Passive range of motion;Dry needling;Manual techniques;Patient/family education;Aquatic Therapy   PT Next Visit Plan Lower trapezius, serratus, and overhead strengthening as tolerated.    Consulted and Agree with Plan of Care Patient        Problem List Patient Active Problem List   Diagnosis Date Noted  . Routine general medical examination at a health care facility 02/22/2014  . Encounter for routine gynecological examination 02/22/2014    Kerman Passey, PT, DPT    12/06/2015, 2:27 PM  [facility] [address], [place], [address] Phone: [phone]   Fax:  [phone]  Name: Amy Sims MRN: [id number] Date of Birth: 07/15/1956

## 2015-12-06 NOTE — Patient Instructions (Addendum)
All exercises provided were adapted from [email or web address]. Patient was provided a written handout with pictures as described. Any additional cues were manually entered in to handout and copied in to this document.   Prone Shoulder Horizontal Abduction  -Lay on edge of bed/mat with arm hanging off side -Raise arm away from bed/mat while keeping elbow straight -Keep shoulder back and down without using traps    ** Once you can do 10 easily, can increase weight  TABLE PUSH UPS   Perform a push up as shown while leaning on a table.

## 2015-12-12 ENCOUNTER — Ambulatory Visit: Payer: 59 | Admitting: Physical Therapy

## 2015-12-12 DIAGNOSIS — M2560 Stiffness of unspecified joint, not elsewhere classified: Secondary | ICD-10-CM

## 2015-12-12 DIAGNOSIS — R29898 Other symptoms and signs involving the musculoskeletal system: Secondary | ICD-10-CM | POA: Diagnosis not present

## 2015-12-12 NOTE — Therapy (Signed)
[facility] [address] [place], [address] Phone: [phone]   Fax:  [phone]  Physical Therapy Treatment  Patient Details  Name: Amy Sims MRN: [id number] Date of Birth: [birth year] No Data Recorded  Encounter Date: 12/12/2015      PT End of Session - 12/12/15 1108    Visit Number 39   Number of Visits 45   Date for PT Re-Evaluation 01/03/16   PT Start Time 1030   PT Stop Time 1108   PT Time Calculation (min) 38 min   Activity Tolerance Patient tolerated treatment well   Behavior During Therapy [facility] for tasks assessed/performed      Past Medical History  Diagnosis Date  . Generalized headaches   . 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 Tooth extraction    . Rotator cuff repair Right     There were no vitals filed for this visit.  Visit Diagnosis:  Stiffness in joint      Subjective Assessment - 12/12/15 1034    Subjective Pt reports she will need to cancel her future appointments as she is going to visit her husband in [place].   Patient is accompained by: Family member   Patient Stated Goals full use of arm   Currently in Pain? No/denies   Pain Score 0-No pain          Objective: Extensive HEP/planning for addressing pt discontinuing PT at this time, focusing on maintaining/improving shoulder flexion, ER, IR, extension.  Supine IR manual stretch with AP mob.  Instruction in and had pt perform same as self stretch using 2 lb wt, progressing to tucking HBB.                         PT Education - 12/12/15 1044    Education provided Yes   Education Details Educated pt on strategies for bearing weight through arm for CPR, overhead strengthening.    Person(s) Educated Patient   Methods Explanation   Comprehension Verbalized understanding             PT Long Term Goals - 12/06/15  1122    PT LONG TERM GOAL #1   Title Patient will decrease Quick DASH score by > 8 points demonstrating reduced self-reported upper extremity disability    Baseline 36%   Time 12   Period Weeks   Status Achieved   PT LONG TERM GOAL #2   Title Patient will be independent with home exercise program for prevention/ self-management of shoulder symptoms/ difficulty    Time 12   Period Weeks   Status Achieved   PT LONG TERM GOAL #3   Title Pt to report 0/10 resting pain, 1-2/10 pain with self-stretching and strengthening program     Status Achieved   PT LONG TERM GOAL #4   Title pt will reach into over head cabinet to retrieve 2lb itm x 5 with minimal shoulder compensation and pain <3/10   Baseline Able    Time 12   Period Weeks   Status Partially Met   PT LONG TERM GOAL #5   Title pt will achieve flexion PROM to 140 deg, Abd to 150 deg, ER to 45 deg demonstrating functional ROM progress.    Baseline PROM flexion 125 deg, abduction 105 deg, ER 30 deg    Time 4  Period Weeks   Status Achieved   Additional Long Term Goals   Additional Long Term Goals Yes   PT LONG TERM GOAL #6   Title Pt will achieve 50 degr. ER to improve functional ROM   Baseline 39 degrees on 12/06/2015   Time 4   Period Weeks   Status Not Met               Plan - 12/12/15 1109    Clinical Impression Statement Pt has made overall progress in AROM as well as strength in R shoulder. She is unable to continue with PT at this time because she needs to return to [place] to see her ailing husband. Pt will return for additional PT following this, in approximately 4-5 wks.   Pt will benefit from skilled therapeutic intervention in order to improve on the following deficits Decreased activity tolerance;Decreased range of motion;Decreased strength;Impaired flexibility;Impaired UE functional use;Pain   Rehab Potential Good   PT Frequency 2x / week   PT Duration 4 weeks   PT Treatment/Interventions  Ultrasound;Cryotherapy;Electrical Stimulation;Moist Heat;Therapeutic exercise;Therapeutic activities;Passive range of motion;Dry needling;Manual techniques;Patient/family education;Aquatic Therapy   PT Next Visit Plan Lower trapezius, serratus, and overhead strengthening as tolerated.    Consulted and Agree with Plan of Care Patient        Problem List Patient Active Problem List   Diagnosis Date Noted  . Routine general medical examination at a health care facility 02/22/2014  . Encounter for routine gynecological examination 02/22/2014    Fisher,Benjamin PT DPT 12/12/2015, 12:00 PM  [facility] [address] [place], [address] Phone: [phone]   Fax:  [phone]  Name: Sereniti Wan MRN: [id number] Date of Birth: 1956-03-22

## 2015-12-18 ENCOUNTER — Encounter: Payer: 59 | Admitting: Physical Therapy

## 2015-12-20 ENCOUNTER — Encounter: Payer: 59 | Admitting: Physical Therapy

## 2015-12-25 ENCOUNTER — Encounter: Payer: 59 | Admitting: Physical Therapy

## 2015-12-27 ENCOUNTER — Encounter: Payer: 59 | Admitting: Physical Therapy

## 2016-01-01 ENCOUNTER — Encounter: Payer: 59 | Admitting: Physical Therapy

## 2016-01-03 ENCOUNTER — Encounter: Payer: 59 | Admitting: Physical Therapy

## 2016-01-21 ENCOUNTER — Ambulatory Visit: Payer: 59 | Attending: Orthopaedic Surgery | Admitting: Physical Therapy

## 2016-01-21 DIAGNOSIS — R29898 Other symptoms and signs involving the musculoskeletal system: Secondary | ICD-10-CM | POA: Insufficient documentation

## 2016-01-21 DIAGNOSIS — M2560 Stiffness of unspecified joint, not elsewhere classified: Secondary | ICD-10-CM | POA: Insufficient documentation

## 2016-01-21 DIAGNOSIS — M6281 Muscle weakness (generalized): Secondary | ICD-10-CM | POA: Insufficient documentation

## 2016-01-21 NOTE — Therapy (Signed)
[facility] [address] [place], [place], [address] Phone: [phone]   Fax:  [phone]  Physical Therapy Treatment  Patient Details  Name: Amy Sims MRN: [id number] Date of Birth: 02/09/1956 No Data Recorded  Encounter Date: 01/21/2016      PT End of Session - 01/21/16 0922    Visit Number 1   Number of Visits 6   Date for PT Re-Evaluation 03/03/16   PT Start Time 0815   PT Stop Time 0900   PT Time Calculation (min) 45 min   Activity Tolerance Patient tolerated treatment well   Behavior During Therapy WFL for tasks assessed/performed      Past Medical History  Diagnosis Date  . Generalized headaches   . 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 Tooth extraction    . Rotator cuff repair Right     There were no vitals filed for this visit.  Visit Diagnosis:  Stiffness in joint  Muscle weakness      Subjective Assessment - 01/21/16 0921    Subjective Pt reports she feels no different from last time she was seen despite performing exercises. Reports pain in clavicle area when doing UE WB pushup exercise. "I think the problem is my muscles more than my shoulder", indicating latissimus area tightness. Report inability to connect bra, reach in high cabinets, and continued stirring. Reports fatiguing easy, but no pain. Reports "not feeling good" about strength/ROM deficits and the lack of progress.    Patient is accompained by: Family member   Patient Stated Goals full use of arm   Currently in Pain? No/denies   Pain Score 0-No pain       Objective:  Reassessed pt ROM, discussed her adherence to HEP while away, reassessed goals and strength. Performed GH AP and inferior grade 4 mob w/ pt in supine 3X45 sec. Pt demonstrated increased PROM in flexion and abduction post mobilizations.  Performed MWM with, pt seated PROM shoulder flexion  in scapular plane while assisting scapular upward rotation/depression, 3X12. Pt reported feeling like she was able to achieve more ROM after this exercise was performed. Shoulder flexion exercise performed w/ RTB 3X10, pt struggled with maintaining shoulder depression/retraction during this exercise requiring manual and verbal cueing to decrease shoulder shrug. Quick DASH: 20.4/100                          PT Education - 01/21/16 0921    Education provided Yes   Education Details shown proper performance of shoulder flexion exercise w/ RTB    Person(s) Educated Patient   Methods Explanation;Demonstration;Tactile cues;Verbal cues   Comprehension Verbal cues required;Returned demonstration;Verbalized understanding             PT Long Term Goals - 01/21/16 0938    PT LONG TERM GOAL #1   Title Patient will decrease Quick DASH score by > 8 points demonstrating reduced self-reported upper extremity disability    Baseline 20%   Time 6   Period Weeks   Status New   PT LONG TERM GOAL #2   Title Pt will be able to clasp bra w/o assistance   Time 6   Period Weeks   Status New   PT LONG TERM GOAL #3   Title Pt wil achieve shoulder flexion of 150 deg w/o paion to enable overhead   activities   Baseline 130 deg   Time 6   Period Weeks   Status New   PT LONG TERM GOAL #4   Title Pt will achieve 50 degr. ER to improve functional ROM   Baseline 39 degrees on 12/06/2015   Time 4   Period Weeks   Status Not Met               Plan - 01/21/16 0737    Clinical Impression Statement Pt is a 60 y.o pleasant female who presents with globally decreased ROM Of the R UE. Pt is unable to perform ADLs including high reaching, bra clasping, and sustained activities requiring shoulder muscle activation. Pt will benefit from manual therapy focused at incr ER and shoulder flexion deficits. Pt will also benefit from global shoulder and scapula stabilizer strengthen to decrease  aberrant movements during shoulder elevation and ADLs.      Pt will benefit from skilled therapeutic intervention in order to improve on the following deficits Decreased activity tolerance;Decreased mobility;Decreased strength;Hypomobility;Decreased endurance   Rehab Potential Good   PT Frequency 1x / week   PT Duration 6 weeks   PT Treatment/Interventions Ultrasound;Cryotherapy;Electrical Stimulation;Moist Heat;Therapeutic exercise;Therapeutic activities;Passive range of motion;Dry needling;Manual techniques;Patient/family education;Aquatic Therapy   PT Next Visit Plan Lower trapezius, serratus, and overhead strengthening as tolerated.    Consulted and Agree with Plan of Care Patient        Problem List Patient Active Problem List   Diagnosis Date Noted  . Routine general medical examination at a health care facility 02/22/2014  . Encounter for routine gynecological examination 02/22/2014    Garfield Cornea 01/21/2016, 9:46 AM  Mont Dutton PT DPT  [facility] [address], [place], [address] Phone: [phone]   Fax:  [phone]  Name: Amy Sims MRN: [id number] Date of Birth: 1956-04-24

## 2016-01-29 ENCOUNTER — Ambulatory Visit: Payer: 59 | Admitting: Physical Therapy

## 2016-01-29 DIAGNOSIS — M6281 Muscle weakness (generalized): Secondary | ICD-10-CM

## 2016-01-29 DIAGNOSIS — R29898 Other symptoms and signs involving the musculoskeletal system: Secondary | ICD-10-CM | POA: Diagnosis not present

## 2016-01-29 DIAGNOSIS — M2560 Stiffness of unspecified joint, not elsewhere classified: Secondary | ICD-10-CM | POA: Diagnosis not present

## 2016-01-29 NOTE — Therapy (Signed)
[facility] [address], [place], [address] Phone: [phone]   Fax:  [phone]  Physical Therapy Treatment  Patient Details  Name: Amy Sims MRN: [id number] Date of Birth: Dec 07, 1956 No Data Recorded  Encounter Date: 01/29/2016      PT End of Session - 01/29/16 1214    Visit Number 41   Number of Visits 50   Date for PT Re-Evaluation 03/03/16   PT Start Time 0930   PT Stop Time 1025   PT Time Calculation (min) 55 min   Activity Tolerance Patient tolerated treatment well   Behavior During Therapy [facility] for tasks assessed/performed      Past Medical History  Diagnosis Date  . Generalized headaches   . 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 Tooth extraction    . Rotator cuff repair Right     There were no vitals filed for this visit.  Visit Diagnosis:  Stiffness in joint  Muscle weakness      Subjective Assessment - 01/29/16 1210    Subjective Pt reports being tender in anterior shoulder for 2 days after last session, also reports having difficulty reaching above head to do hair as well as all other activities requiring overhead reaching. Reports independently continued HEP since last visit.    Patient is accompained by: Family member   Patient Stated Goals full use of arm   Currently in Pain? No/denies   Pain Onset 1 to 4 weeks ago       Objective:  Pretreatment measurements: Shoulder flexion= 132, ER = 31 deg Performed PA shoulder mobs in 90 deg ABD w/ pt supine to address ER deficit 4X45 sec, pt tolerated well demonstrating 7 deg increased ER after performing this treatment. Next shoulder inferior mob was done w/ pt in supine 3X45 sec to addressed decreased shoulder flexion ROM. Pt tolerated well demonstrating an increase of 8 deg ROM post treatment.  Demonstrated and had pt perform shoulder ER stretch using stick  and contralateral UE for increased stretch.  In supine ER MET was than conducted to further address ER deficit 4X20 sec holds and 5 sec resistance periods. Supine shoulder flexion with 5# on stick, from arms extended pt progressed to max shoulder flexion and 20 sec hold then and slowly returned to 90 deg shoulder flexion, performed X 6. Pt tolerated well and discussed taping her 5# DB to a stick to enable her to perform this exercises a home.                           PT Education - 01/29/16 1211    Education provided Yes   Education Details Pt was instructed in proper performance of HEP with extensive instruction to decrease UT activation/shrugging             PT Long Term Goals - 01/29/16 1230    PT LONG TERM GOAL #1   Title Patient will decrease Quick DASH score by > 8 points demonstrating reduced self-reported upper extremity disability    Time 6   Period Weeks   Status Achieved   PT LONG TERM GOAL #2   Title Pt will be able to clasp bra w/o assistance   Time 6   Period Weeks   Status Not Met   PT LONG TERM GOAL #3  Title Pt wil achieve shoulder flexion of 150 deg w/o paion to enable overhead activities   Baseline 130 deg   Time 6   Period Weeks   Status New   PT LONG TERM GOAL #4   Title Pt will achieve 50 degr. ER to improve functional ROM   Baseline 39 degrees on 12/06/2015   Time 4   Period Weeks   Status Not Met   PT LONG TERM GOAL #5   Title pt will achieve flexion PROM to 140 deg, Abd to 150 deg, ER to 45 deg demonstrating functional ROM progress.    Baseline PROM flexion 125 deg, abduction 105 deg, ER 30 deg    Time 4   Period Weeks   Status Achieved   PT LONG TERM GOAL #6   Title Pt will achieve 50 degr. ER to improve functional ROM   Baseline 31 deg   Time 4   Period Weeks   Status Revised               Plan - 01/29/16 1216    Clinical Impression Statement Pt demonstrates a reduced capacity to perform work activities  and ADLs requiring reaching overhead due to global shoulder weakness and reduced GHJ flexion/ER. Pt will benefit from skilled therapy addressing general shoulder strengthening, ER deficit, neuromuscular reeducation, and functional movement exercises to address limitations with ADLs and activity's required to return o work.   Pt will benefit from skilled therapeutic intervention in order to improve on the following deficits Decreased activity tolerance;Decreased mobility;Decreased strength;Hypomobility;Decreased endurance   Rehab Potential Good   PT Frequency 1x / week   PT Duration 6 weeks   PT Treatment/Interventions Ultrasound;Cryotherapy;Electrical Stimulation;Moist Heat;Therapeutic exercise;Therapeutic activities;Passive range of motion;Dry needling;Manual techniques;Patient/family education;Aquatic Therapy   PT Next Visit Plan Lower trapezius, serratus, and overhead strengthening as tolerated.    Consulted and Agree with Plan of Care Patient        Problem List Patient Active Problem List   Diagnosis Date Noted  . Routine general medical examination at a health care facility 02/22/2014  . Encounter for routine gynecological examination 02/22/2014    Garfield Cornea SPT 01/29/2016, 12:37 PM  Mont Dutton PT DPT  [facility] [address] [place], [address] Phone: [phone]   Fax:  [phone]  Name: Amy Sims MRN: [id number] Date of Birth: 1956/05/02

## 2016-02-01 DIAGNOSIS — M75121 Complete rotator cuff tear or rupture of right shoulder, not specified as traumatic: Secondary | ICD-10-CM | POA: Diagnosis not present

## 2016-02-01 DIAGNOSIS — M7581 Other shoulder lesions, right shoulder: Secondary | ICD-10-CM | POA: Diagnosis not present

## 2016-02-04 ENCOUNTER — Ambulatory Visit: Payer: 59 | Admitting: Physical Therapy

## 2016-02-04 ENCOUNTER — Encounter: Payer: 59 | Admitting: Physical Therapy

## 2016-02-04 DIAGNOSIS — M2560 Stiffness of unspecified joint, not elsewhere classified: Secondary | ICD-10-CM

## 2016-02-04 DIAGNOSIS — M6281 Muscle weakness (generalized): Secondary | ICD-10-CM | POA: Diagnosis not present

## 2016-02-04 DIAGNOSIS — R29898 Other symptoms and signs involving the musculoskeletal system: Secondary | ICD-10-CM | POA: Diagnosis not present

## 2016-02-04 NOTE — Therapy (Signed)
[facility] [address], [place], [address] Phone: [phone]   Fax:  [phone]  Physical Therapy Treatment  Patient Details  Name: Amy Sims MRN: [id number] Date of Birth: 09-16-1956 No Data Recorded  Encounter Date: 02/04/2016      PT End of Session - 02/04/16 1115    Visit Number 42   Number of Visits 50   Date for PT Re-Evaluation 03/03/16   Activity Tolerance Patient tolerated treatment well   Behavior During Therapy [facility] for tasks assessed/performed      Past Medical History  Diagnosis Date  . Generalized headaches   . 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 Tooth extraction    . Rotator cuff repair Right     There were no vitals filed for this visit.  Visit Diagnosis:  Stiffness in joint  Muscle weakness  Weakness of shoulder      Subjective Assessment - 02/04/16 1113    Subjective Pt reports a checkup visit w/ shoulder surgeon Friday, wt limitation of 25# from floor to counter was established, also restricted to no overhead movements at work. 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 Pt reports feeling that PA mobs are of the most benefit at this point because of inability to do this at home and limited PT sessions.    Patient is accompained by: Family member   Patient Stated Goals full use of arm   Currently in Pain? No/denies   Pain Score 0-No pain   Pain Onset 1 to 4 weeks ago        Objective:  Assessed shoulder elevation in standing to examine extent of shoulder shrugging. Moderate shoulder shrug noted which decreased with verbal and tactile cueing to depress/retract scapula during elevation.  Performed 15 reps requiring occasional cueing.   ER = 32 deg prior to treatment Performed prone PA grade 4 mobs to R shoulder to address ER deficit 4X45 sec. Pt responded well reporting mild discomfort but no pain. Pt demonstrated increased ROM to 41 deg post mobs.  Performed ER stretch using PVC tubing grasping one end with affected UE, tube across forearm and other UE grasping other end to leverage UE into ER 3X30 sec. Pt tolerated well, discussed using this stretch HEP 2X/day 3X30 sec to maintain ER between sessions.  Side lying ER w/ 1#, initial 3 reps pt was able to attain ER to slightly past neutral, reported fatigue and demonstrated decreased ROM w/ following reps, 3 sets 8 reps.  MWM for shoulder elevation done in seated to address premature scapular rotation during elevation 3X10, assessed active shoulder elevation w/ noted decrease in shrug and improved retraction.                           PT Education - 02/04/16 1115    Education provided Yes   Education Details Don't do sustained shoulder abduction in the car as was reported to be done, but rather devoted time to HEP without distraction to improve posture and exercise from.              PT  Long Term Goals - 01/29/16 1230    PT LONG TERM GOAL #1   Title Patient will decrease Quick DASH score by > 8 points demonstrating reduced self-reported upper extremity disability    Time 6   Period Weeks   Status Achieved   PT LONG TERM GOAL #2   Title Pt will be able to clasp bra w/o assistance   Time 6   Period Weeks   Status Not Met   PT LONG TERM GOAL #3   Title Pt wil achieve shoulder flexion of 150 deg w/o paion to enable overhead activities   Baseline 130 deg   Time 6   Period Weeks   Status New   PT LONG TERM GOAL #4   Title Pt will achieve 50 degr. ER to improve functional ROM   Baseline 39 degrees on 12/06/2015   Time 4   Period Weeks   Status Not Met   PT LONG TERM GOAL #5   Title pt will  achieve flexion PROM to 140 deg, Abd to 150 deg, ER to 45 deg demonstrating functional ROM progress.    Baseline PROM flexion 125 deg, abduction 105 deg, ER 30 deg    Time 4   Period Weeks   Status Achieved   PT LONG TERM GOAL #6   Title Pt will achieve 50 degr. ER to improve functional ROM   Baseline 31 deg   Time 4   Period Weeks   Status Revised               Plan - 02/04/16 1118    Clinical Impression Statement Pt demonstrates improved ER ROM within and between sessions, poor scapulohumeral rhythm w/ elevation, reduced UE strength globally, increased UT tone/activation. Pt will benefit from continued mobs for increased ER ROM and consistent HEP performance to maintain ER gained within session. Continue to address motor control/scapula motor pattern to decrease shrug and increase elevation. Progress pt to increased volume of strengthening exercises as form allows.    Pt will benefit from skilled therapeutic intervention in order to improve on the following deficits Decreased activity tolerance;Decreased mobility;Decreased strength;Hypomobility;Decreased endurance   Rehab Potential Good   PT Frequency 1x / week   PT Duration 6 weeks   PT Treatment/Interventions Ultrasound;Cryotherapy;Electrical Stimulation;Moist Heat;Therapeutic exercise;Therapeutic activities;Passive range of motion;Dry needling;Manual techniques;Patient/family education;Aquatic Therapy   PT Next Visit Plan Lower trapezius, serratus, and overhead strengthening as tolerated.    Consulted and Agree with Plan of Care Patient        Problem List Patient Active Problem List   Diagnosis Date Noted  . Routine general medical examination at a health care facility 02/22/2014  . Encounter for routine gynecological examination 02/22/2014    Vinson Moselle Rij SPT 02/04/2016, 11:22 AM  [facility] [address], [place], [address] Phone:  [phone]   Fax:  [phone]  Name: Amy Sims MRN: [id number] Date of Birth: Jul 08, 1956    This entire session was performed under direct supervision and direction of a licensed therapist/therapist assistant . I have personally read, edited and approve of the note as written.  Kerman Passey, PT, DPT

## 2016-02-11 ENCOUNTER — Ambulatory Visit: Payer: 59 | Admitting: Physical Therapy

## 2016-02-11 DIAGNOSIS — R29898 Other symptoms and signs involving the musculoskeletal system: Secondary | ICD-10-CM | POA: Diagnosis not present

## 2016-02-11 DIAGNOSIS — M6281 Muscle weakness (generalized): Secondary | ICD-10-CM | POA: Diagnosis not present

## 2016-02-11 DIAGNOSIS — M2560 Stiffness of unspecified joint, not elsewhere classified: Secondary | ICD-10-CM

## 2016-02-11 NOTE — Therapy (Signed)
[facility] [address] [place], [address] Phone: [phone]   Fax:  [phone]  Physical Therapy Treatment  Patient Details  Name: Amy Sims MRN: [id number] Date of Birth: 1956-08-20 No Data Recorded  Encounter Date: 02/11/2016      PT End of Session - 02/11/16 0934    Visit Number 43   Number of Visits 50   Date for PT Re-Evaluation 03/03/16   PT Start Time 0910   PT Stop Time 0951   PT Time Calculation (min) 41 min   Activity Tolerance Patient tolerated treatment well   Behavior During Therapy [facility] for tasks assessed/performed      Past Medical History  Diagnosis Date  . Generalized headaches   . 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 Tooth extraction    . Rotator cuff repair Right     There were no vitals filed for this visit.  Visit Diagnosis:  Stiffness in joint  Muscle weakness      Subjective Assessment - 02/11/16 0919    Subjective Discussed previous session with pt. She reports no change insymptoms since prevoius session. Primary concern at this time is with strength when reaching out or when lifting overhead.   Patient is accompained by: Family member   Patient Stated Goals full use of arm   Currently in Pain? No/denies   Pain Onset 1 to 4 weeks ago            Objective: Supine inferior glides at end range shoulder flexion 5x30 grade IV. Following this shoulder shrug decr. To above 120 deg. Flexion from 90.  Supine AP mobs 3x30 grade IV. No change in ER.  C-R performed on R shoulder at 90 deg. Abd, focusing on ER. ER improved from neutral to 45 deg. ER with this.  Post it-notes put in grid pattern on wall at shoulder height and above. Performance of tapping with cuing to minimize UT shrug sign. Able to correction to 90 deg., shrug at 110. Unable to correct with cuing to activate low trap but able to  do so with cuing to relax UT. PT encouraged pt to perform this at home.                          PT Long Term Goals - 01/29/16 1230    PT LONG TERM GOAL #1   Title Patient will decrease Quick DASH score by > 8 points demonstrating reduced self-reported upper extremity disability    Time 6   Period Weeks   Status Achieved   PT LONG TERM GOAL #2   Title Pt will be able to clasp bra w/o assistance   Time 6   Period Weeks   Status Not Met   PT LONG TERM GOAL #3   Title Pt wil achieve shoulder flexion of 150 deg w/o paion to enable overhead activities   Baseline 130 deg   Time 6   Period Weeks   Status New   PT LONG TERM GOAL #4   Title Pt will achieve 50 degr. ER to improve functional ROM   Baseline 39 degrees on 12/06/2015   Time 4   Period Weeks   Status Not Met   PT LONG TERM GOAL #5   Title pt will achieve flexion PROM to 140 deg, Abd to 150  deg, ER to 45 deg demonstrating functional ROM progress.    Baseline PROM flexion 125 deg, abduction 105 deg, ER 30 deg    Time 4   Period Weeks   Status Achieved   PT LONG TERM GOAL #6   Title Pt will achieve 50 degr. ER to improve functional ROM   Baseline 31 deg   Time 4   Period Weeks   Status Revised               Plan - 02/11/16 0941    Clinical Impression Statement pt decr. ER apparently due in part to lat overactivation so treated this out. Focus of final sessions on end range (reaching) strength, reducing UT activation. Pt verbalized that she is not happy to be discharged in the near future however due to pt plateau'ing and per MD suggestion pt will be d/c'd.   Pt will benefit from skilled therapeutic intervention in order to improve on the following deficits Decreased activity tolerance;Decreased mobility;Decreased strength;Hypomobility;Decreased endurance   Rehab Potential Good   PT Frequency 1x / week   PT Duration 6 weeks   PT Treatment/Interventions Ultrasound;Cryotherapy;Electrical  Stimulation;Moist Heat;Therapeutic exercise;Therapeutic activities;Passive range of motion;Dry needling;Manual techniques;Patient/family education;Aquatic Therapy   PT Next Visit Plan Lower trapezius, serratus, and overhead strengthening as tolerated.    Consulted and Agree with Plan of Care Patient        Problem List Patient Active Problem List   Diagnosis Date Noted  . Routine general medical examination at a health care facility 02/22/2014  . Encounter for routine gynecological examination 02/22/2014    Fisher,Benjamin PT DPT 02/11/2016, 10:02 AM  [facility] [facility] [address], [place], [address] Phone: [phone]   Fax:  [phone]  Name: Jahnai Slingerland MRN: [id number] Date of Birth: 1956-08-31

## 2016-02-19 ENCOUNTER — Encounter: Payer: 59 | Admitting: Physical Therapy

## 2016-02-20 ENCOUNTER — Ambulatory Visit: Payer: 59 | Admitting: Physical Therapy

## 2016-02-20 DIAGNOSIS — R29898 Other symptoms and signs involving the musculoskeletal system: Secondary | ICD-10-CM | POA: Insufficient documentation

## 2016-02-20 DIAGNOSIS — M6281 Muscle weakness (generalized): Secondary | ICD-10-CM | POA: Insufficient documentation

## 2016-02-20 DIAGNOSIS — M2560 Stiffness of unspecified joint, not elsewhere classified: Secondary | ICD-10-CM | POA: Insufficient documentation

## 2016-02-20 NOTE — Therapy (Signed)
[facility] [address] [place], [address] Phone: [phone]   Fax:  [phone]  Physical Therapy Treatment/DISCHARGE  Patient Details  Name: Amy Sims MRN: [id number] Date of Birth: 02/15/1956 No Data Recorded  Encounter Date: 02/20/2016      PT End of Session - 02/20/16 0936    Visit Number 44   Number of Visits 50   Date for PT Re-Evaluation 03/03/16   PT Start Time 0915   PT Stop Time 0920   PT Time Calculation (min) 5 min   Activity Tolerance Patient tolerated treatment well   Behavior During Therapy [facility] for tasks assessed/performed      Past Medical History  Diagnosis Date  . Generalized headaches   . 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 Tooth extraction    . Rotator cuff repair Right     There were no vitals filed for this visit.  Visit Diagnosis:  Stiffness in joint  Weakness of shoulder      Subjective Assessment - 02/20/16 0913    Subjective Pt reports over the weekend she had to go to the hospital with her mother. She was also able to clean her refrigerator but did have some difficulty with overhead activity.   Patient is accompained by: Family member   Patient Stated Goals full use of arm   Currently in Pain? No/denies   Pain Score 0-No pain   Pain Onset 1 to 4 weeks ago                                      PT Long Term Goals - 02/20/16 0934    PT LONG TERM GOAL #1   Title Patient will decrease Quick DASH score by > 8 points demonstrating reduced self-reported upper extremity disability    Time 6   Period Weeks   Status Achieved   PT LONG TERM GOAL #2   Title Pt will be able to clasp bra w/o assistance   Time 6   Period Weeks   Status Not Met   PT LONG TERM GOAL #3   Title Pt wil achieve shoulder flexion of 150 deg w/o paion to enable overhead activities   Baseline  130 deg   Time 6   Period Weeks   Status Not Met   PT LONG TERM GOAL #4   Title Pt will achieve 50 degr. ER to improve functional ROM   Baseline 39 degrees on 12/06/2015   Time 4   Period Weeks   Status Not Met   PT LONG TERM GOAL #5   Title pt will achieve flexion PROM to 140 deg, Abd to 150 deg, ER to 45 deg demonstrating functional ROM progress.    Baseline PROM flexion 125 deg, abduction 105 deg, ER 30 deg    Time 4   Period Weeks   Status Achieved   PT LONG TERM GOAL #6   Title Pt will achieve 50 degr. ER to improve functional ROM   Baseline 31 deg   Time 4   Period Weeks   Status Not Met               Plan - 02/20/16 0931    Clinical Impression Statement At this time pt is appropriate for d/c  due to having reached plateau with therapy. Pt verbalized "let's just cancel future appointments" as she also feels that PT is no longer making sustained changes. Pt continues to have limitations in ROM, muscle strength, and functional activity tolerance, however these no longer appear to be changing with therapy.   Pt will benefit from skilled therapeutic intervention in order to improve on the following deficits Decreased activity tolerance;Decreased mobility;Decreased strength;Hypomobility;Decreased endurance   Rehab Potential Good   PT Frequency 1x / week   PT Duration 6 weeks   PT Treatment/Interventions Ultrasound;Cryotherapy;Electrical Stimulation;Moist Heat;Therapeutic exercise;Therapeutic activities;Passive range of motion;Dry needling;Manual techniques;Patient/family education;Aquatic Therapy   PT Next Visit Plan Lower trapezius, serratus, and overhead strengthening as tolerated.    Consulted and Agree with Plan of Care Patient        Problem List Patient Active Problem List   Diagnosis Date Noted  . Routine general medical examination at a health care facility 02/22/2014  . Encounter for routine gynecological examination 02/22/2014    Marshal Schrecengost PT  DPT 02/20/2016, 9:37 AM  [facility] [address], [place], [address] Phone: [phone]   Fax:  [phone]  Name: Amy Sims MRN: [id number] Date of Birth: [birth year]

## 2016-02-26 ENCOUNTER — Encounter: Payer: 59 | Admitting: Physical Therapy

## 2016-03-03 ENCOUNTER — Encounter: Payer: 59 | Admitting: Physical Therapy

## 2016-03-12 ENCOUNTER — Encounter: Payer: 59 | Admitting: Physical Therapy

## 2016-05-02 DIAGNOSIS — M19019 Primary osteoarthritis, unspecified shoulder: Secondary | ICD-10-CM | POA: Diagnosis not present

## 2016-05-02 DIAGNOSIS — M75121 Complete rotator cuff tear or rupture of right shoulder, not specified as traumatic: Secondary | ICD-10-CM | POA: Diagnosis not present

## 2017-12-11 DIAGNOSIS — H5210 Myopia, unspecified eye: Secondary | ICD-10-CM | POA: Diagnosis not present

## 2018-07-09 ENCOUNTER — Other Ambulatory Visit: Payer: Self-pay | Admitting: Primary Care

## 2018-07-09 ENCOUNTER — Ambulatory Visit (INDEPENDENT_AMBULATORY_CARE_PROVIDER_SITE_OTHER): Payer: 59 | Admitting: Primary Care

## 2018-07-09 ENCOUNTER — Other Ambulatory Visit (HOSPITAL_COMMUNITY)
Admission: RE | Admit: 2018-07-09 | Discharge: 2018-07-09 | Disposition: A | Discharge status: Home / Self Care | Payer: 59 | Source: Ambulatory Visit | Attending: Primary Care | Admitting: Primary Care

## 2018-07-09 ENCOUNTER — Encounter: Payer: Self-pay | Admitting: Primary Care

## 2018-07-09 VITALS — BP 122/72 | HR 72 | Temp 97.8°F | Ht <= 58 in | Wt 135.8 lb

## 2018-07-09 DIAGNOSIS — Z0000 Encounter for general adult medical examination without abnormal findings: Secondary | ICD-10-CM

## 2018-07-09 DIAGNOSIS — R7303 Prediabetes: Secondary | ICD-10-CM

## 2018-07-09 DIAGNOSIS — Z1231 Encounter for screening mammogram for malignant neoplasm of breast: Secondary | ICD-10-CM | POA: Diagnosis not present

## 2018-07-09 DIAGNOSIS — Z124 Encounter for screening for malignant neoplasm of cervix: Secondary | ICD-10-CM | POA: Diagnosis not present

## 2018-07-09 DIAGNOSIS — Z1239 Encounter for other screening for malignant neoplasm of breast: Secondary | ICD-10-CM

## 2018-07-09 DIAGNOSIS — N952 Postmenopausal atrophic vaginitis: Secondary | ICD-10-CM | POA: Insufficient documentation

## 2018-07-09 DIAGNOSIS — Z01419 Encounter for gynecological examination (general) (routine) without abnormal findings: Secondary | ICD-10-CM | POA: Diagnosis not present

## 2018-07-09 LAB — LIPID PANEL
CHOL/HDL RATIO: 3
Cholesterol: 180 mg/dL (ref 0–200)
HDL: 56.8 mg/dL (ref >39.00)
LDL CALC: 108 mg/dL — AB (ref 0–99)
NONHDL: 123.17
TRIGLYCERIDES: 77 mg/dL (ref 0.0–149.0)
VLDL: 15.4 mg/dL (ref 0.0–40.0)

## 2018-07-09 LAB — COMPREHENSIVE METABOLIC PANEL
ALBUMIN: 4.1 g/dL (ref 3.5–5.2)
ALT: 13 U/L (ref 0–35)
AST: 15 U/L (ref 0–37)
Alkaline Phosphatase: 73 U/L (ref 39–117)
BUN: 19 mg/dL (ref 6–23)
CHLORIDE: 105 meq/L (ref 96–112)
CO2: 27 mEq/L (ref 19–32)
CREATININE: 0.97 mg/dL (ref 0.40–1.20)
Calcium: 9.4 mg/dL (ref 8.4–10.5)
GFR: 62.09 mL/min (ref >60.00)
GLUCOSE: 90 mg/dL (ref 70–99)
Potassium: 3.6 mEq/L (ref 3.5–5.1)
Sodium: 140 mEq/L (ref 135–145)
TOTAL PROTEIN: 7.7 g/dL (ref 6.0–8.3)
Total Bilirubin: 0.3 mg/dL (ref 0.2–1.2)

## 2018-07-09 LAB — HEMOGLOBIN A1C: HEMOGLOBIN A1C: 6.2 % (ref 4.6–6.5)

## 2018-07-09 NOTE — Assessment & Plan Note (Signed)
Declines Td and Shingles vaccinations. Pap smear due, completed. Mammogram due, orders placed. Declines colon cancer screening. Discussed to use vaginal moisturizer for likely vaginal atrophy. She will update. Consider estrace cream. Exam unremarkable. Labs pending.  Follow up in 1 year for CPE.

## 2018-07-09 NOTE — Assessment & Plan Note (Signed)
Likely the source of her vaginal burning and painful intercourse. Discussed OTC moisturizers, she will try this first. Consider Estrace cream if needed.

## 2018-07-09 NOTE — Patient Instructions (Signed)
Try a vaginal moisturizer 2-3 times weekly for vaginal dryness and painful intercourse. Please update me if no improvement.  Stop by the lab prior to leaving today. I will notify you of your results once received.   Call the [facility] to schedule your mammogram.  We will be in touch once we receive your pap smear results.   Start exercising. You should be getting 150 minutes of moderate intensity exercise weekly.  Ensure you are consuming 64 ounces of water daily.  Follow up in 1 year for your annual exam or sooner if needed.  It was a pleasure meeting you!   Preventive Care 40-64 Years, Female Preventive care refers to lifestyle choices and visits with your health care provider that can promote health and wellness. What does preventive care include?  A yearly physical exam. This is also called an annual well check.  Dental exams once or twice a year.  Routine eye exams. Ask your health care provider how often you should have your eyes checked.  Personal lifestyle choices, including: ? Daily care of your teeth and gums. ? Regular physical activity. ? Eating a healthy diet. ? Avoiding tobacco and drug use. ? Limiting alcohol use. ? Practicing safe sex. ? Taking low-dose aspirin daily starting at age 29. ? Taking vitamin and mineral supplements as recommended by your health care provider. What happens during an annual well check? The services and screenings done by your health care provider during your annual well check will depend on your age, overall health, lifestyle risk factors, and family history of disease. Counseling Your health care provider may ask you questions about your:  Alcohol use.  Tobacco use.  Drug use.  Emotional well-being.  Home and relationship well-being.  Sexual activity.  Eating habits.  Work and work Statistician.  Method of birth control.  Menstrual cycle.  Pregnancy history.  Screening You may have the following  tests or measurements:  Height, weight, and BMI.  Blood pressure.  Lipid and cholesterol levels. These may be checked every 5 years, or more frequently if you are over 19 years old.  Skin check.  Lung cancer screening. You may have this screening every year starting at age 51 if you have a 30-pack-year history of smoking and currently smoke or have quit within the past 15 years.  Fecal occult blood test (FOBT) of the stool. You may have this test every year starting at age 71.  Flexible sigmoidoscopy or colonoscopy. You may have a sigmoidoscopy every 5 years or a colonoscopy every 10 years starting at age 68.  Hepatitis C blood test.  Hepatitis B blood test.  Sexually transmitted disease (STD) testing.  Diabetes screening. This is done by checking your blood sugar (glucose) after you have not eaten for a while (fasting). You may have this done every 1-3 years.  Mammogram. This may be done every 1-2 years. Talk to your health care provider about when you should start having regular mammograms. This may depend on whether you have a family history of breast cancer.  BRCA-related cancer screening. This may be done if you have a family history of breast, ovarian, tubal, or peritoneal cancers.  Pelvic exam and Pap test. This may be done every 3 years starting at age 53. Starting at age 24, this may be done every 5 years if you have a Pap test in combination with an HPV test.  Bone density scan. This is done to screen for osteoporosis. You may have this scan  if you are at high risk for osteoporosis.  Discuss your test results, treatment options, and if necessary, the need for more tests with your health care provider. Vaccines Your health care provider may recommend certain vaccines, such as:  Influenza vaccine. This is recommended every year.  Tetanus, diphtheria, and acellular pertussis (Tdap, Td) vaccine. You may need a Td booster every 10 years.  Varicella vaccine. You may need  this if you have not been vaccinated.  Zoster vaccine. You may need this after age 46.  Measles, mumps, and rubella (MMR) vaccine. You may need at least one dose of MMR if you were born in 1957 or later. You may also need a second dose.  Pneumococcal 13-valent conjugate (PCV13) vaccine. You may need this if you have certain conditions and were not previously vaccinated.  Pneumococcal polysaccharide (PPSV23) vaccine. You may need one or two doses if you smoke cigarettes or if you have certain conditions.  Meningococcal vaccine. You may need this if you have certain conditions.  Hepatitis A vaccine. You may need this if you have certain conditions or if you travel or work in places where you may be exposed to hepatitis A.  Hepatitis B vaccine. You may need this if you have certain conditions or if you travel or work in places where you may be exposed to hepatitis B.  Haemophilus influenzae type b (Hib) vaccine. You may need this if you have certain conditions.  Talk to your health care provider about which screenings and vaccines you need and how often you need them. This information is not intended to replace advice given to you by your health care provider. Make sure you discuss any questions you have with your health care provider. Document Released: 12/28/2015 Document Revised: 08/20/2016 Document Reviewed: 10/02/2015 Elsevier Interactive Patient Education  [employer].

## 2018-07-09 NOTE — Progress Notes (Signed)
Subjective:    Patient ID: Amy Sims, female    DOB: 07-06-1956, 62 y.o.   MRN: [id number]  HPI  Amy Sims is a 62 year old female who presents today to transfer care from Dr. Dayton Martes and for complete physical.   Immunizations: -Tetanus: Unsure, believes it's been over 10 years, declines -Influenza: Completes every season -Shingles: Declines  Diet:  She endorses a healthy diet. Breakfast: Eggs Lunch: Pita bread, meat, vegetables, salad, rice Dinner: Veggies, rice, meat Snacks: None Desserts: None Beverages: Water, hot tea, juice, occasional soda  Exercise: She is not exercising Eye exam: Completes annually  Dental exam: Completes semi-annually  Colonoscopy: Declines Pap Smear: Completed in 2015. Due today. Mammogram: Due, completed in 2014.   Review of Systems  Constitutional: Negative for unexpected weight change.  HENT: Negative for rhinorrhea.   Respiratory: Negative for cough and shortness of breath.   Cardiovascular: Negative for chest pain.  Gastrointestinal: Negative for constipation and diarrhea.  Genitourinary: Negative for difficulty urinating.       Vaginal dryness, pain with intercourse  Musculoskeletal: Positive for arthralgias. Negative for myalgias.       Intermittent knee arthralgias, overall tolerable.   Skin: Negative for rash.  Allergic/Immunologic: Negative for environmental allergies.  Neurological: Negative for dizziness, numbness and headaches.  Psychiatric/Behavioral: The patient is not nervous/anxious.        Past Medical History:  Diagnosis Date  . Generalized headaches   . 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 Marital status: Married    Spouse name: Not on file  . Number of children: Not on file  . Years of education: Not on file  . Highest education level: Not on file  Occupational History  . Not on file  Social Needs  . Financial resource strain: Not on file  . Food  insecurity:    Worry: Not on file    Inability: Not on file  . Transportation needs:    Medical: Not on file    Non-medical: Not on file  Tobacco Use  . Smoking status: Never Smoker  . Smokeless tobacco: Never Used  Substance and Sexual Activity  . Alcohol use: No  . Drug use: No  . Sexual activity: Yes  Lifestyle  . Physical activity:    Days per week: Not on file    Minutes per session: Not on file  . Stress: Not on file  Relationships  . Social connections:    Talks on phone: Not on file    Gets together: Not on file    Attends religious service: Not on file    Active member of club or organization: Not on file    Attends meetings of clubs or organizations: Not on file    Relationship status: Not on file  . Intimate partner violence:    Fear of current or ex partner: Not on file    Emotionally abused: Not on file    Physically abused: Not on file    Forced sexual activity: Not on file  Other Topics Concern  . 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   Married.   3 children.    Past Surgical History:  Procedure Laterality Date  . ROTATOR CUFF REPAIR Right   . TOOTH EXTRACTION      Family History  Problem Relation Age of Onset  . Hypertension Mother   .  Stroke Mother   . Cancer Sister [id number]       non hodgkins lymphoma    No Known Allergies  No current outpatient medications on file prior to visit.   No current facility-administered medications on file prior to visit.     BP 122/72 (BP Location: Left Arm, Patient Position: Sitting, Cuff Size: Normal)   Pulse 72   Temp 97.8 F (36.6 C) (Oral)   Ht 4' 9.5" (1.461 m)   Wt 135 lb 12 oz (61.6 kg)   SpO2 99%   BMI 28.87 kg/m    Objective:   Physical Exam  Constitutional: She is oriented to person, place, and time. She appears well-nourished.  HENT:  Mouth/Throat: No oropharyngeal exudate.  Eyes: Pupils are equal, round, and reactive to light. EOM are normal.  Neck: Neck  supple. No thyromegaly present.  Cardiovascular: Normal rate and regular rhythm.  Respiratory: Effort normal and breath sounds normal.  GI: Soft. Bowel sounds are normal. There is no tenderness.  Genitourinary: Cervix exhibits no motion tenderness and no discharge. Right adnexum displays no mass and no tenderness. Left adnexum displays no mass and no tenderness. No erythema in the vagina. No vaginal discharge found.  Musculoskeletal: Normal range of motion.  Neurological: She is alert and oriented to person, place, and time.  Skin: Skin is warm and dry.  Psychiatric: She has a normal mood and affect.           Assessment & Plan:

## 2018-07-13 LAB — CYTOLOGY - PAP
Diagnosis: NEGATIVE
HPV (WINDOPATH): NOT DETECTED

## 2019-03-04 ENCOUNTER — Other Ambulatory Visit: Payer: Self-pay

## 2019-03-04 ENCOUNTER — Encounter: Payer: Self-pay | Admitting: Primary Care

## 2019-03-04 ENCOUNTER — Ambulatory Visit: Payer: 59 | Admitting: Primary Care

## 2019-03-04 VITALS — BP 124/82 | HR 81 | Temp 98.7°F | Wt 133.0 lb

## 2019-03-04 DIAGNOSIS — J3489 Other specified disorders of nose and nasal sinuses: Secondary | ICD-10-CM | POA: Diagnosis not present

## 2019-03-04 NOTE — Progress Notes (Signed)
Subjective:    Patient ID: Amy Sims, female    DOB: 1956/08/21, 63 y.o.   MRN: [id number]  HPI  Amy Sims is a 63 year old female who presents today with a chief complaint of nasal pain and swelling.  She first noticed this one week ago. Pain is located to the nasal bridge down to the tip of her nose. Yesterday she noticed some swelling around the nasal bridge, her glasses made an indention into her skin after 10 minutes. She denies sinus pressure, nasal congestion, rhinorrhea, cough, rhinorrhea, allergies, changes to skin color, changes in soaps/detergents. She doesn't wear face make up.   Review of Systems  Constitutional: Negative for fever.  HENT: Negative for congestion, postnasal drip, sinus pressure and sore throat.        Nasal pain  Eyes: Negative for visual disturbance.  Respiratory: Negative for cough and shortness of breath.   Skin: Negative for color change.  Allergic/Immunologic: Negative for environmental allergies.       Past Medical History:  Diagnosis Date  . Generalized headaches   . 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 Marital status: Married    Spouse name: Not on file  . Number of children: Not on file  . Years of education: Not on file  . Highest education level: Not on file  Occupational History  . Not on file  Social Needs  . Financial resource strain: Not on file  . Food insecurity:    Worry: Not on file    Inability: Not on file  . Transportation needs:    Medical: Not on file    Non-medical: Not on file  Tobacco Use  . Smoking status: Never Smoker  . Smokeless tobacco: Never Used  Substance and Sexual Activity  . Alcohol use: No  . Drug use: No  . Sexual activity: Yes  Lifestyle  . Physical activity:    Days per week: Not on file    Minutes per session: Not on file  . Stress: Not on file  Relationships  . Social connections:    Talks on phone: Not on file    Gets  together: Not on file    Attends religious service: Not on file    Active member of club or organization: Not on file    Attends meetings of clubs or organizations: Not on file    Relationship status: Not on file  . Intimate partner violence:    Fear of current or ex partner: Not on file    Emotionally abused: Not on file    Physically abused: Not on file    Forced sexual activity: Not on file  Other Topics Concern  . 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   3 children.    Past Surgical History:  Procedure Laterality Date  . ROTATOR CUFF REPAIR Right   . TOOTH EXTRACTION      Family History  Problem Relation Age of Onset  . Hypertension Mother   . Stroke Mother   . Cancer Sister 20       non hodgkins lymphoma    No Known Allergies  No current outpatient medications on file prior to visit.   No current facility-administered medications on file prior to visit.     BP 124/82   Pulse 81   Temp 98.7 F (  37.1 C) (Oral)   Wt 133 lb (60.3 kg)   SpO2 98%   BMI 28.28 kg/m    Objective:   Physical Exam  Constitutional: She appears well-nourished. She does not appear ill.  HENT:  Right Ear: Tympanic membrane and ear canal normal.  Left Ear: Tympanic membrane and ear canal normal.  Nose: No mucosal edema or rhinorrhea. No epistaxis.  No foreign bodies. Right sinus exhibits no maxillary sinus tenderness and no frontal sinus tenderness. Left sinus exhibits no maxillary sinus tenderness and no frontal sinus tenderness.    Mouth/Throat: Oropharynx is clear and moist. No oral lesions. No dental abscesses.  Tender to anterior nose from bridge to tip. No obvious swelling. No color changes.   Neck: Neck supple.  Cardiovascular: Normal rate.  Respiratory: Effort normal and breath sounds normal.  Skin: Skin is warm and dry. No rash noted. No erythema.           Assessment & Plan:  Nose Pain:  Present for one week, no injury or  respiratory symptoms. Exam today without obvious cause.  Unclear etiology, doesn't seem to be allergies. No obvious cellulitis. Will trial Ibuprofen 600 mg BID x 3-4 days. Consider Flonase and/or antihistamine. Recommended she monitor with Ibuprofen and report any color changes, increased swelling, or respiratory symptoms.  Doreene Nest, NP

## 2019-03-04 NOTE — Patient Instructions (Signed)
Start ibuprofen 600 mg twice daily for the next 3-5 days.  Please update me next week if you develop redness, increased swelling, sinus pressure/runny nose, fevers.  It was a pleasure to see you today!

## 2019-04-16 ENCOUNTER — Emergency Department
Admission: EM | Admit: 2019-04-16 | Discharge: 2019-04-16 | Disposition: A | Discharge status: Home / Self Care | Payer: 59 | Attending: Emergency Medicine | Admitting: Emergency Medicine

## 2019-04-16 ENCOUNTER — Emergency Department: Payer: 59

## 2019-04-16 ENCOUNTER — Encounter: Payer: Self-pay | Admitting: Emergency Medicine

## 2019-04-16 ENCOUNTER — Other Ambulatory Visit: Payer: Self-pay

## 2019-04-16 DIAGNOSIS — M79661 Pain in right lower leg: Secondary | ICD-10-CM | POA: Diagnosis not present

## 2019-04-16 DIAGNOSIS — M25561 Pain in right knee: Secondary | ICD-10-CM | POA: Diagnosis not present

## 2019-04-16 DIAGNOSIS — M79604 Pain in right leg: Secondary | ICD-10-CM | POA: Diagnosis not present

## 2019-04-16 DIAGNOSIS — M7989 Other specified soft tissue disorders: Secondary | ICD-10-CM | POA: Diagnosis not present

## 2019-04-16 LAB — CBC WITH DIFFERENTIAL/PLATELET
Abs Immature Granulocytes: 0.02 10*3/uL (ref 0.00–0.07)
Basophils Absolute: 0 10*3/uL (ref 0.0–0.1)
Basophils Relative: 1 %
Eosinophils Absolute: 0.2 10*3/uL (ref 0.0–0.5)
Eosinophils Relative: 3 %
HCT: 37.7 % (ref 36.0–46.0)
Hemoglobin: 12.4 g/dL (ref 12.0–15.0)
Immature Granulocytes: 0 %
Lymphocytes Relative: 42 %
Lymphs Abs: 3.2 10*3/uL (ref 0.7–4.0)
MCH: 28.2 pg (ref 26.0–34.0)
MCHC: 32.9 g/dL (ref 30.0–36.0)
MCV: 85.9 fL (ref 80.0–100.0)
Monocytes Absolute: 0.5 10*3/uL (ref 0.1–1.0)
Monocytes Relative: 7 %
Neutro Abs: 3.7 10*3/uL (ref 1.7–7.7)
Neutrophils Relative %: 47 %
Platelets: 275 10*3/uL (ref 150–400)
RBC: 4.39 MIL/uL (ref 3.87–5.11)
RDW: 14 % (ref 11.5–15.5)
WBC: 7.7 10*3/uL (ref 4.0–10.5)
nRBC: 0 % (ref 0.0–0.2)

## 2019-04-16 LAB — COMPREHENSIVE METABOLIC PANEL
ALT: 15 U/L (ref 0–44)
AST: 17 U/L (ref 15–41)
Albumin: 4 g/dL (ref 3.5–5.0)
Alkaline Phosphatase: 79 U/L (ref 38–126)
Anion gap: 8 (ref 5–15)
BUN: 21 mg/dL (ref 8–23)
CO2: 24 mmol/L (ref 22–32)
Calcium: 9.2 mg/dL (ref 8.9–10.3)
Chloride: 107 mmol/L (ref 98–111)
Creatinine, Ser: 0.85 mg/dL (ref 0.44–1.00)
GFR calc Af Amer: >60 mL/min (ref >60)
GFR calc non Af Amer: >60 mL/min (ref >60)
Glucose, Bld: 105 mg/dL — ABNORMAL HIGH (ref 70–99)
Potassium: 3.8 mmol/L (ref 3.5–5.1)
Sodium: 139 mmol/L (ref 135–145)
Total Bilirubin: 0.5 mg/dL (ref 0.3–1.2)
Total Protein: 7.9 g/dL (ref 6.5–8.1)

## 2019-04-16 MED ORDER — TRAMADOL HCL 50 MG PO TABS: 50.0000 mg | ORAL_TABLET | Freq: Four times a day (QID) | ORAL | 0 refills | Status: AC | PRN

## 2019-04-16 NOTE — ED Triage Notes (Signed)
Pt to ED from [facility] for evaluation of eight leg pain. Pt states that her leg is painful and she is unable to stand on it. Pt states that there is also a little swelling in the leg. Pt is in NAD at this time.

## 2019-04-16 NOTE — ED Notes (Signed)
Patient transported to Ultrasound 

## 2019-04-16 NOTE — ED Provider Notes (Signed)
[facility] Emergency Department Provider Note   ____________________________________________   First MD Initiated Contact with Patient 04/16/19 1146     (approximate)  I have reviewed the triage vital signs and the nursing notes.   HISTORY  Chief Complaint Leg Pain    HPI Amy Sims is a 63 y.o. female pain and swelling of right leg especially behind the knee.  There is also some swelling and pain in the upper calf.  This started several days ago seem to get better then got worse again.  It is now bad enough that she really cannot bear weight on it.  She has no history of trauma.  Does not remember any popping or clicking.         Past Medical History:  Diagnosis Date  . Generalized headaches   . 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 Vaginal atrophy 07/09/2018  . Routine general medical examination at a health care facility 02/22/2014  . Encounter for routine gynecological examination 02/22/2014    Past Surgical History:  Procedure Laterality Date  . ROTATOR CUFF REPAIR Right   . TOOTH EXTRACTION      Prior to Admission medications   Medication Sig Start Date End Date Taking? Authorizing Provider  acetaminophen (TYLENOL) 325 MG tablet Take 650 mg by mouth every 6 (six) hours as needed.   Yes [provider]  ibuprofen (ADVIL) 200 MG tablet Take 200 mg by mouth every 6 (six) hours as needed.   Yes [provider]  traMADol (ULTRAM) 50 MG tablet Take 1 tablet (50 mg total) by mouth every 6 (six) hours as needed. 04/16/19 04/15/20  Arnaldo Natal, MD    Allergies Patient has no known allergies.  Family History  Problem Relation Age of Onset  . Hypertension Mother   . Stroke Mother   . Cancer Sister 20       non hodgkins lymphoma    Social History Social History   Tobacco Use  . Smoking status: Never Smoker  . Smokeless tobacco: Never Used   Substance Use Topics  . Alcohol use: No  . Drug use: No    Review of Systems  Constitutional: No fever/chills Eyes: No visual changes. ENT: No sore throat. Cardiovascular: Denies chest pain. Respiratory: Denies shortness of breath. Gastrointestinal: No abdominal pain.  No nausea, no vomiting.  No diarrhea.  No constipation. Genitourinary: Negative for dysuria. Musculoskeletal: Negative for back pain. Skin: Negative for rash. Neurological: Negative for headaches, focal weakness   ____________________________________________   PHYSICAL EXAM:  VITAL SIGNS: ED Triage Vitals  Enc Vitals Group     BP 04/16/19 1135 (!) 152/95     Pulse Rate 04/16/19 1135 67     Resp 04/16/19 1135 16     Temp 04/16/19 1135 98.6 F (37 C)     Temp Source 04/16/19 1135 Oral     SpO2 04/16/19 1135 98 %     Weight 04/16/19 1133 133 lb (60.3 kg)     Height 04/16/19 1133 4\' 11"  (1.499 m)     Head Circumference --      Peak Flow --      Pain Score 04/16/19 1133 10     Pain Loc --      Pain Edu? --      Excl. in GC? --     Constitutional: Alert and oriented. Well appearing  and in no acute distress. Eyes: Conjunctivae are normal.  Head: Atraumatic. Nose: No congestion/rhinnorhea. Mouth/Throat: Mucous membranes are moist.  Oropharynx non-erythematous. Neck: No stridor. Cardiovascular: Normal rate, regular rhythm. Grossly normal heart sounds.  Good peripheral circulation. Respiratory: Normal respiratory effort.  No retractions. Lungs CTAB. Gastrointestinal: Soft and nontender. No distention. No abdominal bruits. No CVA tenderness. Musculoskeletal: Left leg is essentially normal right leg has some pain behind the knee and a small amount of swelling.  There is also a little bit of pain in the upper calf posteriorly.  Normal dorsalis pedis pulses normal sensation distally Neurologic:  Normal speech and language. No gross focal neurologic deficits are appreciated.  Skin:  Skin is warm, dry and intact.  No rash noted.   ____________________________________________   LABS (all labs ordered are listed, but only abnormal results are displayed)  Labs Reviewed  COMPREHENSIVE METABOLIC PANEL - Abnormal; Notable for the following components:      Result Value   Glucose, Bld 105 (*)    All other components within normal limits  CBC WITH DIFFERENTIAL/PLATELET   ____________________________________________  EKG   ____________________________________________  RADIOLOGY  ED MD interpretation: Ultrasound of the leg is negative for DVT also they did not see any swelling or Baker's cyst.  Knee x-ray is negative I reviewed those films.  Official radiology report(s): US Venous Img Lower Unilateral Right  Result Date: 04/16/2019 CLINICAL DATA:  Pain and swelling in calf and behind knee EXAM: RIGHT LOWER EXTREMITY VENOUS DOPPLER ULTRASOUND TECHNIQUE: Gray-scale sonography with compression, as well as color and duplex ultrasound, were performed to evaluate the deep venous system from the level of the common femoral vein through the popliteal and proximal calf veins. COMPARISON:  08/09/2010 FINDINGS: Normal compressibility of the common femoral, superficial femoral, and popliteal veins, as well as the proximal calf veins. No filling defects to suggest DVT on grayscale or color Doppler imaging. Doppler waveforms show normal direction of venous flow, normal respiratory phasicity and response to augmentation. Survey views of the contralateral common femoral vein are unremarkable. IMPRESSION: No femoropopliteal and no calf DVT in the visualized calf veins. If clinical symptoms are inconsistent or if there are persistent or worsening symptoms, further imaging (possibly involving the iliac veins) may be warranted. Electronically Signed   By: Corlis Leak M.D.   On: 04/16/2019 12:37   Dg Knee Complete 4 Views Right  Result Date: 04/16/2019 CLINICAL DATA:  No known injury, no redness, no swelling. But unable to  walk or bear wt, worsening ambulatory pain X 2-3 weeks. EXAM: RIGHT KNEE - COMPLETE 4+ VIEW COMPARISON:  None. FINDINGS: No evidence of fracture, dislocation, or joint effusion. Mild medial femorotibial compartment joint space narrowing. Soft tissues are unremarkable. IMPRESSION: No acute osseous injury of the right knee. Electronically Signed   By: Elige Ko   On: 04/16/2019 13:23    ____________________________________________   PROCEDURES  Procedure(s) performed (including Critical Care):  Procedures   ____________________________________________   INITIAL IMPRESSION / ASSESSMENT AND PLAN / ED COURSE   Exam looks within normal limits there is no sign of knee effusion knee x-ray is negative ultrasound of the leg is negative not sure what is causing the patient's pain she has full range of motion with no pops or clicks in the joint I will have her follow-up with orthopedics.             ____________________________________________   FINAL CLINICAL IMPRESSION(S) / ED DIAGNOSES  Final diagnoses:  Right leg pain  ED Discharge Orders         Ordered    traMADol (ULTRAM) 50 MG tablet  Every 6 hours PRN     04/16/19 1314           Note:  This document was prepared using Dragon voice recognition software and may include unintentional dictation errors.    Arnaldo NatalMalinda,  F, MD 04/16/19 [phone]

## 2019-04-16 NOTE — Discharge Instructions (Addendum)
Please try the Ultram 1 pill 4 times a day for pain.  Use your crutches.  Return here for increasing pain swelling redness or fever.  Please call Dr. Odis Luster the orthopedic surgeon on Monday morning let him know you are in the emergency room for this pain and cannot walk.  He should be out of follow-up with you.

## 2019-04-18 ENCOUNTER — Other Ambulatory Visit: Payer: Self-pay | Admitting: Physician Assistant

## 2019-04-18 DIAGNOSIS — M25561 Pain in right knee: Secondary | ICD-10-CM

## 2019-04-18 DIAGNOSIS — M2391 Unspecified internal derangement of right knee: Secondary | ICD-10-CM | POA: Diagnosis not present

## 2019-05-16 ENCOUNTER — Ambulatory Visit
Admission: RE | Admit: 2019-05-16 | Discharge: 2019-05-16 | Disposition: A | Discharge status: Home / Self Care | Payer: 59 | Source: Ambulatory Visit | Attending: Physician Assistant | Admitting: Physician Assistant

## 2019-05-16 ENCOUNTER — Other Ambulatory Visit: Payer: Self-pay

## 2019-05-16 DIAGNOSIS — M25561 Pain in right knee: Secondary | ICD-10-CM

## 2019-05-16 DIAGNOSIS — M2391 Unspecified internal derangement of right knee: Secondary | ICD-10-CM

## 2019-06-02 DIAGNOSIS — M2391 Unspecified internal derangement of right knee: Secondary | ICD-10-CM | POA: Diagnosis not present

## 2019-07-04 NOTE — Discharge Instructions (Signed)
°  Instructions after Knee Arthroscopy  ° ° Tirth Cothron P. Jebidiah Baggerly, Jr., M.D.    ° Dept. of Orthopaedics & Sports Medicine ° [facility] ° [address] ° [place], [place]  [address] ° ° Phone: [phone]   Fax: [phone] ° ° °DIET: °• Drink plenty of non-alcoholic fluids & begin a light diet. °• Resume your normal diet the day after surgery. ° °ACTIVITY:  °• You may use crutches or a walker with weight-bearing as tolerated, unless instructed otherwise. °• You may wean yourself off of the walker or crutches as tolerated.  °• Begin doing gentle exercises. Exercising will reduce the pain and swelling, increase motion, and prevent muscle weakness.   °• Avoid strenuous activities or athletics for a minimum of 4-6 weeks after arthroscopic surgery. °• Do not drive or operate any equipment until instructed. ° °WOUND CARE:  °• Place one to two pillows under the knee the first day or two when sitting or lying.  °• Continue to use the ice packs periodically to reduce pain and swelling. °• The small incisions in your knee are closed with nylon stitches. The stitches will be removed in the office. °• The bulky dressing may be removed on the second day after surgery. DO NOT TOUCH THE STITCHES. Put a Band-Aid over each stitch. Do NOT use any ointments or creams on the incisions.  °• You may bathe or shower after the stitches are removed at the first office visit following surgery. ° °MEDICATIONS: °• You may resume your regular medications. °• Please take the pain medication as prescribed. °• Do not take pain medication on an empty stomach. °• Do not drive or drink alcoholic beverages when taking pain medications. ° °CALL THE OFFICE FOR: °• Temperature above 101 degrees °• Excessive bleeding or drainage on the dressing. °• Excessive swelling, coldness, or paleness of the toes. °• Persistent nausea and vomiting. ° °FOLLOW-UP:  °• You should have an appointment to return to the office in 7-10 days after surgery.  °  °

## 2019-07-08 ENCOUNTER — Other Ambulatory Visit: Payer: Self-pay

## 2019-07-08 ENCOUNTER — Encounter
Admission: RE | Admit: 2019-07-08 | Discharge: 2019-07-08 | Disposition: A | Discharge status: Home / Self Care | Payer: 59 | Source: Ambulatory Visit | Attending: Orthopedic Surgery | Admitting: Orthopedic Surgery

## 2019-07-08 DIAGNOSIS — Z1159 Encounter for screening for other viral diseases: Secondary | ICD-10-CM | POA: Insufficient documentation

## 2019-07-08 NOTE — Patient Instructions (Signed)
Your procedure is scheduled on: Wed. 7/29 Report to Day Surgery. To find out your arrival time please call [phone] between 1PM - 3PM on Tues 7/28.  Remember: Instructions that are not followed completely may result in serious medical risk,  up to and including death, or upon the discretion of your surgeon and anesthesiologist your  surgery may need to be rescheduled.     _X__ 1. Do not eat food after midnight the night before your procedure.                 No gum chewing or hard candies. You may drink clear liquids up to 2 hours                 before you are scheduled to arrive for your surgery- DO not drink clear                 liquids within 2 hours of the start of your surgery.                 Clear Liquids include:  water, apple juice without pulp, clear carbohydrate                 drink such as Clearfast of Gatorade, Black Coffee or Tea (Do not add                 anything to coffee or tea).  __X__2.  On the morning of surgery brush your teeth with toothpaste and water, you                may rinse your mouth with mouthwash if you wish.  Do not swallow any toothpaste of mouthwash.     _X__ 3.  No Alcohol for 24 hours before or after surgery.   ___ 4.  Do Not Smoke or use e-cigarettes For 24 Hours Prior to Your Surgery.                 Do not use any chewable tobacco products for at least 6 hours prior to                 surgery.  ____  5.  Bring all medications with you on the day of surgery if instructed.   __x__  6.  Notify your doctor if there is any change in your medical condition      (cold, fever, infections).     Do not wear jewelry, make-up, hairpins, clips or nail polish. Do not wear lotions, powders, or perfumes. You may wear deodorant. Do not shave 48 hours prior to surgery. Men may shave face and neck. Do not bring valuables to the hospital.    [facility] is not responsible for any belongings or valuables.  Contacts, dentures  or bridgework may not be worn into surgery. Leave your suitcase in the car. After surgery it may be brought to your room. For patients admitted to the hospital, discharge time is determined by your treatment team.   Patients discharged the day of surgery will not be allowed to drive home.   Please read over the following fact sheets that you were given:    __x__ Take these medicines the morning of surgery with A SIP OF WATER:    1. none  2.   3.   4.  5.  6.  ____ Fleet Enema (as directed)   _x___ Use CHG Soap as directed  ____ Use inhalers on the day of  surgery  ____ Stop metformin 2 days prior to surgery    ____ Take 1/2 of usual insulin dose the night before surgery. No insulin the morning          of surgery.   ____ Stop Coumadin/Plavix/aspirin on   __x__ Stop Anti-inflammatories Ibuprofen or Aleve or Aspirin now   May take tylenol and tramadol   ____ Stop supplements until after surgery.    ____ Bring C-Pap to the hospital.

## 2019-07-09 LAB — SARS CORONAVIRUS 2 (TAT 6-24 HRS): SARS Coronavirus 2: NEGATIVE

## 2019-07-13 ENCOUNTER — Other Ambulatory Visit: Payer: Self-pay

## 2019-07-13 ENCOUNTER — Ambulatory Visit: Payer: 59 | Admitting: Anesthesiology

## 2019-07-13 ENCOUNTER — Encounter
Admission: RE | Disposition: A | Discharge status: Home / Self Care | Payer: Self-pay | Source: Home / Self Care | Attending: Orthopedic Surgery

## 2019-07-13 ENCOUNTER — Ambulatory Visit
Admission: RE | Admit: 2019-07-13 | Discharge: 2019-07-13 | Disposition: A | Discharge status: Home / Self Care | Payer: 59 | Attending: Orthopedic Surgery | Admitting: Orthopedic Surgery

## 2019-07-13 ENCOUNTER — Encounter: Payer: Self-pay | Admitting: Orthopedic Surgery

## 2019-07-13 DIAGNOSIS — X58XXXA Exposure to other specified factors, initial encounter: Secondary | ICD-10-CM | POA: Diagnosis not present

## 2019-07-13 DIAGNOSIS — M2391 Unspecified internal derangement of right knee: Secondary | ICD-10-CM | POA: Diagnosis present

## 2019-07-13 DIAGNOSIS — Z9889 Other specified postprocedural states: Secondary | ICD-10-CM

## 2019-07-13 DIAGNOSIS — S83241A Other tear of medial meniscus, current injury, right knee, initial encounter: Secondary | ICD-10-CM | POA: Diagnosis not present

## 2019-07-13 DIAGNOSIS — M23221 Derangement of posterior horn of medial meniscus due to old tear or injury, right knee: Secondary | ICD-10-CM | POA: Diagnosis not present

## 2019-07-13 DIAGNOSIS — M94261 Chondromalacia, right knee: Secondary | ICD-10-CM | POA: Diagnosis not present

## 2019-07-13 DIAGNOSIS — M1711 Unilateral primary osteoarthritis, right knee: Secondary | ICD-10-CM | POA: Diagnosis not present

## 2019-07-13 DIAGNOSIS — M2341 Loose body in knee, right knee: Secondary | ICD-10-CM | POA: Diagnosis not present

## 2019-07-13 HISTORY — PX: KNEE ARTHROSCOPY: SHX127

## 2019-07-13 SURGERY — ARTHROSCOPY, KNEE
Anesthesia: General | Laterality: Right

## 2019-07-13 MED ORDER — FAMOTIDINE 20 MG PO TABS
20.0000 mg | ORAL_TABLET | Freq: Once | ORAL | Status: AC
  Administered 2019-07-13: 20 mg via ORAL

## 2019-07-13 MED ORDER — ACETAMINOPHEN 10 MG/ML IV SOLN
INTRAVENOUS | Status: DC | PRN
  Administered 2019-07-13: 1000 mg via INTRAVENOUS

## 2019-07-13 MED ORDER — HYDROCODONE-ACETAMINOPHEN 5-325 MG PO TABS: 1.0000 | ORAL_TABLET | ORAL | 0 refills | Status: DC | PRN

## 2019-07-13 MED ORDER — METOCLOPRAMIDE HCL 5 MG/ML IJ SOLN: 5.0000 mg | Freq: Three times a day (TID) | INTRAMUSCULAR | Status: DC | PRN

## 2019-07-13 MED ORDER — PROPOFOL 10 MG/ML IV BOLUS
INTRAVENOUS | Status: DC | PRN
  Administered 2019-07-13: 150 mg via INTRAVENOUS

## 2019-07-13 MED ORDER — ONDANSETRON HCL 4 MG/2ML IJ SOLN: 4.0000 mg | Freq: Four times a day (QID) | INTRAMUSCULAR | Status: DC | PRN

## 2019-07-13 MED ORDER — FENTANYL CITRATE (PF) 100 MCG/2ML IJ SOLN
INTRAMUSCULAR | Status: AC
  Filled 2019-07-13: qty 2

## 2019-07-13 MED ORDER — ONDANSETRON HCL 4 MG PO TABS: 4.0000 mg | ORAL_TABLET | Freq: Four times a day (QID) | ORAL | Status: DC | PRN

## 2019-07-13 MED ORDER — LACTATED RINGERS IV SOLN
INTRAVENOUS | Status: DC
  Administered 2019-07-13: 14:00:00 via INTRAVENOUS

## 2019-07-13 MED ORDER — CHLORHEXIDINE GLUCONATE 4 % EX LIQD: 60.0000 mL | Freq: Once | CUTANEOUS | Status: DC

## 2019-07-13 MED ORDER — MORPHINE SULFATE 4 MG/ML IJ SOLN
INTRAMUSCULAR | Status: DC | PRN
  Administered 2019-07-13: 4 mg via INTRAVENOUS

## 2019-07-13 MED ORDER — MORPHINE SULFATE (PF) 4 MG/ML IV SOLN
2.0000 mg | INTRAVENOUS | Status: DC | PRN
  Administered 2019-07-13 (×2): 2 mg via INTRAVENOUS

## 2019-07-13 MED ORDER — ONDANSETRON HCL 4 MG/2ML IJ SOLN
INTRAMUSCULAR | Status: DC | PRN
  Administered 2019-07-13: 4 mg via INTRAVENOUS

## 2019-07-13 MED ORDER — FAMOTIDINE 20 MG PO TABS
ORAL_TABLET | ORAL | Status: AC
  Administered 2019-07-13: 20 mg via ORAL
  Filled 2019-07-13: qty 1

## 2019-07-13 MED ORDER — PROPOFOL 10 MG/ML IV BOLUS
INTRAVENOUS | Status: AC
  Filled 2019-07-13: qty 20

## 2019-07-13 MED ORDER — METOCLOPRAMIDE HCL 10 MG PO TABS: 5.0000 mg | ORAL_TABLET | Freq: Three times a day (TID) | ORAL | Status: DC | PRN

## 2019-07-13 MED ORDER — BUPIVACAINE-EPINEPHRINE (PF) 0.25% -1:200000 IJ SOLN
INTRAMUSCULAR | Status: AC
  Filled 2019-07-13: qty 30

## 2019-07-13 MED ORDER — MIDAZOLAM HCL 2 MG/2ML IJ SOLN
INTRAMUSCULAR | Status: AC
  Filled 2019-07-13: qty 2

## 2019-07-13 MED ORDER — MORPHINE SULFATE (PF) 4 MG/ML IV SOLN
INTRAVENOUS | Status: AC
  Filled 2019-07-13: qty 1

## 2019-07-13 MED ORDER — BUPIVACAINE-EPINEPHRINE 0.25% -1:200000 IJ SOLN
INTRAMUSCULAR | Status: DC | PRN
  Administered 2019-07-13: 30 mL

## 2019-07-13 MED ORDER — DEXAMETHASONE SODIUM PHOSPHATE 10 MG/ML IJ SOLN
INTRAMUSCULAR | Status: DC | PRN
  Administered 2019-07-13: 8 mg via INTRAVENOUS

## 2019-07-13 MED ORDER — MORPHINE SULFATE (PF) 4 MG/ML IV SOLN
INTRAVENOUS | Status: AC
  Administered 2019-07-13: 2 mg via INTRAVENOUS
  Filled 2019-07-13: qty 1

## 2019-07-13 MED ORDER — MIDAZOLAM HCL 2 MG/2ML IJ SOLN
INTRAMUSCULAR | Status: DC | PRN
  Administered 2019-07-13: 2 mg via INTRAVENOUS

## 2019-07-13 MED ORDER — ACETAMINOPHEN 10 MG/ML IV SOLN
INTRAVENOUS | Status: AC
  Filled 2019-07-13: qty 100

## 2019-07-13 MED ORDER — SODIUM CHLORIDE 0.9 % IV SOLN: INTRAVENOUS | Status: DC

## 2019-07-13 MED ORDER — FENTANYL CITRATE (PF) 100 MCG/2ML IJ SOLN
INTRAMUSCULAR | Status: DC | PRN
  Administered 2019-07-13 (×2): 25 ug via INTRAVENOUS
  Administered 2019-07-13: 50 ug via INTRAVENOUS

## 2019-07-13 MED ORDER — LIDOCAINE HCL (CARDIAC) PF 100 MG/5ML IV SOSY
PREFILLED_SYRINGE | INTRAVENOUS | Status: DC | PRN
  Administered 2019-07-13: 80 mg via INTRAVENOUS

## 2019-07-13 MED ORDER — HYDROCODONE-ACETAMINOPHEN 5-325 MG PO TABS
ORAL_TABLET | ORAL | Status: AC
  Administered 2019-07-13: 19:00:00 1
  Filled 2019-07-13: qty 2

## 2019-07-13 SURGICAL SUPPLY — 25 items
BLADE SHAVER 4.5 DBL SERAT CV (CUTTER) IMPLANT
COVER WAND RF STERILE (DRAPES) ×2 IMPLANT
CUFF TOURN SGL QUICK 24 (TOURNIQUET CUFF)
CUFF TOURN SGL QUICK 30 (TOURNIQUET CUFF)
CUFF TRNQT CYL 24X4X16.5-23 (TOURNIQUET CUFF) IMPLANT
CUFF TRNQT CYL 30X4X21-28X (TOURNIQUET CUFF) IMPLANT
DRSG DERMACEA 8X12 NADH (GAUZE/BANDAGES/DRESSINGS) ×2 IMPLANT
DURAPREP 26ML APPLICATOR (WOUND CARE) ×4 IMPLANT
GAUZE SPONGE 4X4 12PLY STRL (GAUZE/BANDAGES/DRESSINGS) ×2 IMPLANT
GLOVE BIOGEL M STRL SZ7.5 (GLOVE) ×2 IMPLANT
GLOVE INDICATOR 8.0 STRL GRN (GLOVE) ×2 IMPLANT
GOWN STRL REUS W/ TWL LRG LVL3 (GOWN DISPOSABLE) ×2 IMPLANT
GOWN STRL REUS W/TWL LRG LVL3 (GOWN DISPOSABLE) ×4
IV LACTATED RINGER IRRG 3000ML (IV SOLUTION) ×12
IV LR IRRIG 3000ML ARTHROMATIC (IV SOLUTION) ×6 IMPLANT
KIT TURNOVER KIT A (KITS) ×2 IMPLANT
MANIFOLD NEPTUNE II (INSTRUMENTS) ×2 IMPLANT
PACK ARTHROSCOPY KNEE (MISCELLANEOUS) ×2 IMPLANT
SET TUBE SUCT SHAVER OUTFL 24K (TUBING) ×2 IMPLANT
SET TUBE TIP INTRA-ARTICULAR (MISCELLANEOUS) ×2 IMPLANT
SUT ETHILON 3-0 FS-10 30 BLK (SUTURE) ×2
SUTURE EHLN 3-0 FS-10 30 BLK (SUTURE) ×1 IMPLANT
TUBING ARTHRO INFLOW-ONLY STRL (TUBING) ×2 IMPLANT
WAND HAND CNTRL MULTIVAC 50 (MISCELLANEOUS) ×2 IMPLANT
WRAP KNEE W/COLD PACKS 25.5X14 (SOFTGOODS) ×2 IMPLANT

## 2019-07-13 NOTE — Transfer of Care (Signed)
Immediate Anesthesia Transfer of Care Note  Patient: Amy Sims  Procedure(s) Performed: ARTHROSCOPY KNEE, Removeal of chondral lose body, medial menisectomy (Right )  Patient Location: PACU  Anesthesia Type:General  Level of Consciousness: awake, drowsy and patient cooperative  Airway & Oxygen Therapy: Patient Spontanous Breathing  Post-op Assessment: Report given to RN, Post -op Vital signs reviewed and stable and Patient moving all extremities  Post vital signs: Reviewed and stable  Last Vitals:  Vitals Value Taken Time  BP 141/90 07/13/19 1726  Temp 36.2 C 07/13/19 1726  Pulse 77 07/13/19 1733  Resp 11 07/13/19 1733  SpO2 100 % 07/13/19 1733  Vitals shown include unvalidated device data.  Last Pain:  Vitals:   07/13/19 1726  TempSrc:   PainSc: 0-No pain         Complications: No apparent anesthesia complications

## 2019-07-13 NOTE — TOC Progression Note (Signed)
Transition of Care [facility]) - Progression Note    Patient Details  Name: Amy Sims MRN: [id number] Date of Birth: 07-19-1956  Transition of Care [facility]) CM/SW [place], RN Phone Number: 07/13/2019, 8:47 AM  Clinical Narrative:     Requested the price of Lovenox will notify the patient once obtained       Expected Discharge Plan and Services                                                 Social Determinants of Health (SDOH) Interventions    Readmission Risk Interventions No flowsheet data found.

## 2019-07-13 NOTE — Op Note (Signed)
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   ASSISTANT: none  ANESTHESIA: general  ESTIMATED BLOOD LOSS: Minimal  FLUIDS REPLACED: 800 mL of crystalloid  TOURNIQUET TIME: Not used  INDICATIONS FOR SURGERY: Amy Sims is a 63 y.o. year old female who has been seen for complaints of right knee pain. MRI demonstrated findings consistent with meniscal pathology. After discussion of the risks and benefits of surgical intervention, the patient expressed understanding of the risks benefits and agree with plans for right knee arthroscopy.   PROCEDURE IN DETAIL: The patient was brought into the operating room and, after adequate general anesthesia was achieved, a tourniquet was applied to the right thigh and the leg was placed in the leg holder. All bony prominences were well padded. The patient's right knee was cleaned and prepped with alcohol and Duraprep and draped in the usual sterile fashion. A "timeout" was performed as per usual protocol. The anticipated portal sites were injected with 0.25% Marcaine with epinephrine. An anterolateral incision was made and a cannula was inserted. A large effusion was evacuated and the knee was distended with fluid using the pump. The scope was advanced down the medial gutter into the medial compartment. Under visualization with the scope, an anteromedial portal was created and a hooked probe was inserted. The medial meniscus was visualized and probed.  There was a radial tear  involving the posterior horn of the medial meniscus.  The tear was debrided and contoured using a meniscal punches and a 4.5 mm shaver.  Final contouring was performed using the 50 degree ArthroCare wand.  The remaining rim of meniscus was visualized and probed and felt be stable. The articular cartilage was visualized.  There were grade 3 changes of chondromalacia involving the medial femoral condyle.  These areas were debrided and contoured using the ArthroCare wand.  The scope was then advanced into the intercondylar notch. The anterior cruciate ligament was visualized and probed and felt to be intact. The scope was removed from the lateral portal and reinserted via the anteromedial portal to better visualize the lateral compartment. The lateral meniscus was visualized and probed.  The lateral meniscus was intact. The articular cartilage of the lateral compartment was visualized and noted to be in excellent condition.  While visualizing the lateral compartment, a chondral loose body was noted.  The loose body exited the lateral compartment and entered the intercondylar notch.  Pituitary forceps were used to remove the chondral loose body which measured 8 mm x 6 mm.  Finally, the scope was advanced so as to visualize the patellofemoral articulation. Good patellar tracking was appreciated.  The articular surface was in good condition.  The knee was irrigated with copius amounts of fluid and suctioned dry. The anterolateral portal was re-approximated with #3-0 nylon. A combination of 0.25% Marcaine with epinephrine and 4 mg of Morphine were injected via the scope. The scope was removed and the anteromedial portal was re-approximated with #3-0 nylon. A sterile dressing was applied followed by application of an ice wrap.  The  patient tolerated the procedure well and was transported to the PACU in stable condition.   P. Holley Bouche., M.D.

## 2019-07-13 NOTE — H&P (Signed)
The patient has been re-examined, and the chart reviewed, and there have been no interval changes to the documented history and physical.    The risks, benefits, and alternatives have been discussed at length. The patient expressed understanding of the risks benefits and agreed with plans for surgical intervention.  Jacquan Savas P. Mariea Mcmartin, Jr. M.D.    

## 2019-07-13 NOTE — Anesthesia Postprocedure Evaluation (Signed)
Anesthesia Post Note  Patient: Nature conservation officer  Procedure(s) Performed: ARTHROSCOPY KNEE, Removeal of chondral lose body, medial menisectomy (Right )  Patient location during evaluation: PACU Anesthesia Type: General Level of consciousness: awake and alert Pain management: pain level controlled Vital Signs Assessment: post-procedure vital signs reviewed and stable Respiratory status: spontaneous breathing, nonlabored ventilation, respiratory function stable and patient connected to nasal cannula oxygen Cardiovascular status: blood pressure returned to baseline and stable Postop Assessment: no apparent nausea or vomiting Anesthetic complications: no     Last Vitals:  Vitals:   07/13/19 1803 07/13/19 1813  BP:  (!) 146/90  Pulse: 68 72  Resp: 17 16  Temp:  (!) 36.2 C  SpO2: 100% 100%    Last Pain:  Vitals:   07/13/19 1813  TempSrc: Temporal  PainSc: 8                  Broadus John K Piscitello

## 2019-07-13 NOTE — TOC Benefit Eligibility Note (Signed)
Transition of Care [facility]) Benefit Eligibility Note    Patient Details  Name: Amy Sims MRN: [id number] Date of Birth: 06-05-1956   Medication/Dose: Enoxaparin 40mg  once daily for 14 days  Covered?: Yes  Prescription Coverage Preferred Pharmacy: [facility] with Person/Company/Phone Number:: [place] with [employer] at [phone]  Co-Pay: $100.81 estimated copay  Prior Approval: No  Deductible: (No deductible)   Dannette Barbara Phone Number: [phone] or [phone] 07/13/2019, 12:04 PM

## 2019-07-13 NOTE — Anesthesia Preprocedure Evaluation (Signed)
Anesthesia Evaluation  Patient identified by MRN, date of birth, ID band Patient awake    Reviewed: Allergy & Precautions, H&P , NPO status , Patient's Chart, lab work & pertinent test results  Airway Mallampati: II  TM Distance: >3 FB Neck ROM: full    Dental  (+) Teeth Intact, Implants   Pulmonary neg pulmonary ROS, neg shortness of breath, neg COPD, neg recent URI,           Cardiovascular (-) hypertension(-) angina(-) Past MI and (-) Cardiac Stents negative cardio ROS  (-) dysrhythmias      Neuro/Psych  Headaches, negative psych ROS   GI/Hepatic negative GI ROS, Neg liver ROS, neg GERD  ,  Endo/Other  negative endocrine ROS  Renal/GU      Musculoskeletal   Abdominal   Peds  Hematology negative hematology ROS (+)   Anesthesia Other Findings Past Medical History: No date: Generalized headaches No date: Positive TB test     Comment:  had cxr which denied TB  Past Surgical History: No date: ROTATOR CUFF REPAIR; Right No date: TOOTH EXTRACTION     Reproductive/Obstetrics negative OB ROS                             Anesthesia Physical Anesthesia Plan  ASA: I  Anesthesia Plan: General LMA   Post-op Pain Management:    Induction:   PONV Risk Score and Plan: Dexamethasone, Ondansetron, Midazolam and Treatment may vary due to age or medical condition  Airway Management Planned:   Additional Equipment:   Intra-op Plan:   Post-operative Plan:   Informed Consent: I have reviewed the patients History and Physical, chart, labs and discussed the procedure including the risks, benefits and alternatives for the proposed anesthesia with the patient or authorized representative who has indicated his/her understanding and acceptance.     Dental Advisory Given  Plan Discussed with: Anesthesiologist and CRNA  Anesthesia Plan Comments:         Anesthesia Quick Evaluation

## 2019-07-13 NOTE — Anesthesia Post-op Follow-up Note (Signed)
Anesthesia QCDR form completed.        

## 2019-07-13 NOTE — Anesthesia Procedure Notes (Signed)
Procedure Name: LMA Insertion Date/Time: 07/13/2019 3:40 PM Performed by: Lowry Bowl, CRNA Pre-anesthesia Checklist: Patient identified, Emergency Drugs available, Suction available and Patient being monitored Patient Re-evaluated:Patient Re-evaluated prior to induction Oxygen Delivery Method: Circle system utilized Preoxygenation: Pre-oxygenation with 100% oxygen Induction Type: IV induction Ventilation: Mask ventilation without difficulty LMA: LMA inserted LMA Size: 3.5 Number of attempts: 1 Placement Confirmation: positive ETCO2 and breath sounds checked- equal and bilateral Tube secured with: Tape Dental Injury: Teeth and Oropharynx as per pre-operative assessment

## 2019-07-14 ENCOUNTER — Encounter: Payer: Self-pay | Admitting: Orthopedic Surgery

## 2019-08-30 ENCOUNTER — Ambulatory Visit: Payer: 59 | Attending: Orthopedic Surgery

## 2019-08-30 ENCOUNTER — Other Ambulatory Visit: Payer: Self-pay

## 2019-08-30 DIAGNOSIS — M6281 Muscle weakness (generalized): Secondary | ICD-10-CM | POA: Diagnosis not present

## 2019-08-30 DIAGNOSIS — M25561 Pain in right knee: Secondary | ICD-10-CM | POA: Insufficient documentation

## 2019-08-30 DIAGNOSIS — R262 Difficulty in walking, not elsewhere classified: Secondary | ICD-10-CM | POA: Insufficient documentation

## 2019-08-30 NOTE — Patient Instructions (Signed)
Recommended for pt to get arch supports to help decrease R lateral knee pain during stance phase of gait and promote R knee flexion during swing phase afterwards. Pt verbalized understanding.

## 2019-08-30 NOTE — Therapy (Signed)
[facility] PHYSICAL AND SPORTS MEDICINE [address] [place], [address] Phone: [phone]   Fax:  [phone]  Physical Therapy Evaluation  Patient Details  Name: Amy Sims MRN: [id number] Date of Birth: 03/19/1956 Referring Provider (PT): Illene Labrador. Hooten, MD   Encounter Date: 08/30/2019  PT End of Session - 08/30/19 0934    Visit Number  1    Number of Visits  13    Date for PT Re-Evaluation  10/13/19    PT Start Time  0934    PT Stop Time  1036    PT Time Calculation (min)  62 min    Activity Tolerance  Patient tolerated treatment well    Behavior During Therapy  [facility] for tasks assessed/performed       Past Medical History:  Diagnosis Date  . Generalized headaches   . 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 KNEE ARTHROSCOPY Right 07/13/2019   Procedure: ARTHROSCOPY KNEE, Removeal of chondral lose body, medial menisectomy;  Surgeon: Donato Heinz, MD;  Location: [facility] ORS;  Service: Orthopedics;  Laterality: Right;  . ROTATOR CUFF REPAIR Right   . TOOTH EXTRACTION      There were no vitals filed for this visit.   Subjective Assessment - 08/30/19 0939    Subjective  R knee: 0/10 currently, 4/10 R knee pain at most for the past 3 weeks (usually after activity or after long rest over 30 minutes)    Pertinent History  S/P R knee arthroscopy 07/13/2019 with menisectomy and loose body removal.  Main problem is mobility. R knee feels stiff, especially on the lateral side. started her home exercises from her surgeon a week after her surgery. Has been doing them 2x a day. Has not yet had PT for her knee pain or since after the surgery.   Going back to work 09/06/2019, Pt does a lot of standing and walking. Has time to sit down as well. Works in the NICU.  Currently unable to kneel down. Has not been to work since her surgery. Started driving 3 weeks since the surgery. Prior  to her surgery, around June 11, 2019 pt got out of bed and could not walk. Was painful, went to the ER, was negative for DVT. Saw her MD and who thought that the problem was due to the loose body.   Had an MRI which showed a meniscal tear. Her surgery was delayed due to COVID. Has a difficult time bending her knee since the injury.  Currently uses a [facility] for long distances for safety. Has 15 steps to go up to her bed room. Going up the steps is easier but going down the steps is harder (walks backwards to go down steps).    Patient Stated Goals  No pain, decrease R lateral knee tightness.    Currently in Pain?  No/denies    Pain Score  0-No pain   sitting at rest   Pain Location  Knee    Pain Orientation  Right    Pain Descriptors / Indicators  Tightness   "pain"   Pain Type  Acute pain;Surgical pain    Pain Onset  More than a month ago    Pain Frequency  Occasional    Aggravating Factors   walking after prolonged sitting (about 20-30 minutes) bothers her knees. Doing her exercises losens it up and feels better. However  walking for about 30 minutes bothers her knee and pt then has a difficulty bending her knee.    Pain Relieving Factors  ice, Tylenol, movement (but not too much), rest (but not too much).         [facility] PT Assessment - 08/30/19 0935      Assessment   Medical Diagnosis  S/P Arthroscopy    Referring Provider (PT)  Illene LabradorJames P. Hooten, MD    Onset Date/Surgical Date  07/13/19    Prior Therapy  no known PT for current condition       Precautions   Precaution Comments  no known precautions      Restrictions   Other Position/Activity Restrictions  WBAT      Prior Function   Vocation  Full time employment   Nurse at NICU at [employer]   Vocation Requirements  PLOF: less difficulty walking      Observation/Other Assessments   Observations  surgical incisions healed      Posture/Postural Control   Posture Comments  B protracted shoulders, slight R lateral shift, B genu varus, B  foot pronation      AROM   Right Knee Extension  0   with R lateral knee stiffness and slight pain   Right Knee Flexion  113   122 AAROM   Left Knee Extension  0    Left Knee Flexion  130      Strength   Right Hip Extension  4/5   R gastroc/sciatic pain when returning knee from flexion   Right Hip External Rotation   4-/5   R lateral knee pain   Right Hip ABduction  4-/5    Left Hip Extension  4-/5    Left Hip External Rotation  4/5    Left Hip ABduction  4-/5    Right Knee Flexion  4-/5   reprduction of lat knee pain when lat hamstring targeted   Right Knee Extension  4+/5   calf pain   Left Knee Flexion  4/5    Left Knee Extension  4+/5      Palpation   Palpation comment  No reproduction of R gastroc/sciatic symptoms with R and L P to A to lumbar transverse processes or R posterior hip, thigh, knee, and calf.       Special Tests   Other special tests  (-) Slump, (-) SLR flexion R LE       Ambulation/Gait   Gait velocity       Gait Comments  decreased R knee flexion during R LE swing phase, antalgic, decreased stance R LE, lateral lean.                 Objective measurements completed on examination: See above findings.    Right knee arthroscopy, partial medial meniscectomy, chondroplasty, and removal of a chondral loose body On 07/13/2019   S/P R knee arthroscopy 07/13/2019 with menisectomy and loose body removal.    No latex band allergies   Manual therapy  Seated STM R latearl hamstrings, IT band and vastus lateralis    Gait with R arch support Decreased R knee pain with gait, improved swing phase   Patient is a 63 year old female who came to physical therapy S/P R knee arthroscopy, partial medial menisectomy, chondroplasty, and removal of chondral loose body on 07/13/2019. She also presents with R knee pain, altered gait pattern and posture, bilateral hip and knee weakness, limited R knee flexion AROM, R foot pronation during  closed chain  activities, and difficulty performing standing tasks such as prolonged ambulation, and picking up items from lower surfaces. Pt will benefit from skilled physical therapy services to address the aforementioned deficits.      PT Education - 08/30/19 1536    Education provided  Yes    Education Details  plan of care, arch supports    Person(s) Educated  Patient    Methods  Explanation;Demonstration;Tactile cues;Verbal cues    Comprehension  Returned demonstration;Verbalized understanding       PT Short Term Goals - 08/30/19 1550      PT SHORT TERM GOAL #1   Title  Patient will be independent with her HEP to decrease R knee pain, improve LE strength, function, and decrease difficulty ambulating.    Time  3    Period  Weeks    Status  New    Target Date  09/22/19        PT Long Term Goals - 08/30/19 1551      PT LONG TERM GOAL #1   Title  Patient will have a decrease in R knee pain to 2/10 at worst to decrease difficulty with gait as well as improve ability to lift items from lower surfaces.    Baseline  4/10 R knee pain at most for the past 3 weeks (08/30/2019)    Time  6    Period  Weeks    Status  New    Target Date  10/13/19      PT LONG TERM GOAL #2   Title  Patient will improve R knee flexion AROM to 125 degrees or more to promote ability to ambulate as well as negotiate stairs.    Baseline  113 degrees seated R knee flexion AROM (08/30/2019)    Time  6    Period  Weeks    Status  New    Target Date  10/13/19      PT LONG TERM GOAL #3   Title  Pt will improve bilateral hip extension and abduction strength by at least 1/2 MMT strength to promote ability ambulate with less R lateral knee pain.    Baseline  hip extension 4/5 R, 4-/5 L, hip abduction 4-/5 R and L (08/30/2019)    Time  6    Period  Weeks    Status  New    Target Date  10/13/19             Plan - 08/30/19 1537    Clinical Impression Statement  Patient is a 63 year old female who came to physical  therapy S/P R knee arthroscopy, partial medial menisectomy, chondroplasty, and removal of chondral loose body on 07/13/2019. She also presents with R knee pain, altered gait pattern and posture, bilateral hip and knee weakness, limited R knee flexion AROM, R foot pronation during closed chain activities, and difficulty performing standing tasks such as prolonged ambulation, and picking up items from lower surfaces. Pt will benefit from skilled physical therapy services to address the aforementioned deficits.    Personal Factors and Comorbidities  Age    Examination-Activity Limitations  Squat;Stairs    Stability/Clinical Decision Making  Stable/Uncomplicated    Clinical Decision Making  Low    Rehab Potential  Fair    PT Frequency  2x / week    PT Duration  6 weeks    PT Treatment/Interventions  Aquatic Therapy;Electrical Stimulation;Iontophoresis 4mg /ml Dexamethasone;Ultrasound;Gait training;Stair training;Therapeutic activities;Therapeutic exercise;Neuromuscular re-education;Patient/family education;Manual techniques;Dry needling    PT  Next Visit Plan  hip and knee strengthening, femoral control, manual techniques, modalities PRN    Consulted and Agree with Plan of Care  Patient       Patient will benefit from skilled therapeutic intervention in order to improve the following deficits and impairments:  Pain, Postural dysfunction, Improper body mechanics, Difficulty walking, Decreased strength, Decreased range of motion, Abnormal gait  Visit Diagnosis: Acute pain of right knee - Plan: PT plan of care cert/re-cert  Muscle weakness (generalized) - Plan: PT plan of care cert/re-cert  Difficulty in walking, not elsewhere classified - Plan: PT plan of care cert/re-cert     Problem List Patient Active Problem List   Diagnosis Date Noted  . Vaginal atrophy 07/09/2018  . Routine general medical examination at a health care facility 02/22/2014  . Encounter for routine gynecological examination  02/22/2014    Loralyn FreshwaterMiguel Xzavion Doswell PT, DPT   08/30/2019, 5:51 PM  [facility] [address] [place], [place], [address] Phone: [phone]   Fax:  [phone]  Name: Amy Sims MRN: [id number] Date of Birth: [birth year]

## 2019-09-05 ENCOUNTER — Other Ambulatory Visit: Payer: Self-pay

## 2019-09-05 ENCOUNTER — Ambulatory Visit: Payer: 59

## 2019-09-05 DIAGNOSIS — R262 Difficulty in walking, not elsewhere classified: Secondary | ICD-10-CM

## 2019-09-05 DIAGNOSIS — M6281 Muscle weakness (generalized): Secondary | ICD-10-CM | POA: Diagnosis not present

## 2019-09-05 DIAGNOSIS — M25561 Pain in right knee: Secondary | ICD-10-CM | POA: Diagnosis not present

## 2019-09-05 NOTE — Patient Instructions (Addendum)
  Seated hip extension isometrics   Sitting on a chair,    Squeeze your rear end muscles together and press your right foot onto the floor.     Hold for 5 seconds    Repeat 10 times   Perform 3 sets daily.       Access Code: [email or web address]  URL: [email or web address]  Date: 09/05/2019  Prepared by: Joneen Boers   Exercises Seated Hip Adduction Isometrics with Diona Foley - 10 reps - 3 sets - 5 seonds hold - 1x daily - 7x weekly Prone Hip Extension with Bent Knee - One Pillow - 10 reps - 3 sets - 1x daily - 7x weekly LE neural flossing - 10 reps - 3 sets - 1x daily - 7x weekly

## 2019-09-05 NOTE — Therapy (Signed)
[facility]IC[facility] [address] [place], [place], [address] Phone: [phone]   Fax:  [phone]  Physical Therapy Treatment  Patient Details  Name: Amy MonsSamina Bluitt MRN: [id number] Date of Birth: 18-Mar-1956 Referring Provider (PT): Illene LabradorJames P. Hooten, MD   Encounter Date: 09/05/2019  PT End of Session - 09/05/19 1034    Visit Number  2    Number of Visits  13    Date for PT Re-Evaluation  10/13/19    PT Start Time  1034    PT Stop Time  1133    PT Time Calculation (min)  59 min    Activity Tolerance  Patient tolerated treatment well    Behavior During Therapy  WFL for tasks assessed/performed       Past Medical History:  Diagnosis Date  . Generalized headaches   . 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 KNEE ARTHROSCOPY Right 07/13/2019   Procedure: ARTHROSCOPY KNEE, Removeal of chondral lose body, medial menisectomy;  Surgeon: Donato HeinzHooten, James P, MD;  Location: [facility] ORS;  Service: Orthopedics;  Laterality: Right;  . ROTATOR CUFF REPAIR Right   . TOOTH EXTRACTION      There were no vitals filed for this visit.  Subjective Assessment - 09/05/19 1038    Subjective  R knee is the same. 2/10 R knee pain when walking currently.    Pertinent History  S/P R knee arthroscopy 07/13/2019 with menisectomy and loose body removal.  Main problem is mobility. R knee feels stiff, especially on the lateral side. started her home exercises from her surgeon a week after her surgery. Has been doing them 2x a day. Has not yet had PT for her knee pain or since after the surgery.   Going back to work 09/06/2019, Pt does a lot of standing and walking. Has time to sit down as well. Works in the NICU.  Currently unable to kneel down. Has not been to work since her surgery. Started driving 3 weeks since the surgery. Prior to her surgery, around June 11, 2019 pt got out of bed and could not  walk. Was painful, went to the ER, was negative for DVT. Saw her MD and who thought that the problem was due to the loose body.   Had an MRI which showed a meniscal tear. Her surgery was delayed due to COVID. Has a difficult time bending her knee since the injury.  [facility] for [facility]. Has 15 steps to go up to her bed room. Going up the steps is easier but going down the steps is harder (walks backwards to go down steps).    Patient Stated Goals  No pain, decrease R lateral knee tightness.    Currently in Pain?  Yes    Pain Score  2     Pain Onset  More than a month ago                               PT Education - 09/05/19 1110    Education provided  Yes    Education Details  ther-ex, HEP    Person(s) Educated  Patient    Methods  Explanation;Demonstration;Tactile cues;Verbal cues;Handout    Comprehension  Returned demonstration;Verbalized understanding        Objective    Right knee  arthroscopy, partial medial meniscectomy, chondroplasty, and removal of a chondral loose body On 07/13/2019   S/P R knee arthroscopy 07/13/2019 with menisectomy and loose body removal.    No latex band allergies  7-8 weeks post op      Manual therapy  Prone low dye tape R foot to support arch. No change in R knee pain with gait.   Prone STM R lateral hamstrings     Therapeutic exercise  seated hip adductor pillow and glute max squeeze 10x5 seconds for 3 sets  Decreased R lateral knee pain during knee flexion with gait    Seated manually resisted R knee flexion targeting the medial hamstrings 10x3  Increased R lateral knee pain  Seated R hip extension isometrics 10x5 seconds x 3. Decreased R knee pain with gait    Prone glute max extension R LE 10x3  Seated R knee extension neural flossing 10x3  Decreased R knee pain with gait   Standing R lateral shift correction 10x5 seconds for 2 sets  Improved exercise technique, movement  at target joints, use of target muscles after mod verbal, visual, tactile cues.      Response to treatment Good muscle use felt with exercises. Decreased R knee pain with swing phase and heel strike to foot flat following treatment to improve R glute max muscle strength and improving R LE sciatic neural mobility.    Clinical impression  Pt demonstrates  R LE neural tension in which treatment to promote neural flossing as well as improving R glute muscle use helped decrease R lateral knee pain during gait and improved R knee flexion during swing phase. Pt will benefit from continued skilled physical therapy services to decrease pain, improve ROM, strength, function and decrease difficulty with gait.            PT Short Term Goals - 08/30/19 1550      PT SHORT TERM GOAL #1   Title  Patient will be independent with her HEP to decrease R knee pain, improve LE strength, function, and decrease difficulty ambulating.    Time  3    Period  Weeks    Status  New    Target Date  09/22/19        PT Long Term Goals - 08/30/19 1551      PT LONG TERM GOAL #1   Title  Patient will have a decrease in R knee pain to 2/10 at worst to decrease difficulty with gait as well as improve ability to lift items from lower surfaces.    Baseline  4/10 R knee pain at most for the past 3 weeks (08/30/2019)    Time  6    Period  Weeks    Status  New    Target Date  10/13/19      PT LONG TERM GOAL #2   Title  Patient will improve R knee flexion AROM to 125 degrees or more to promote ability to ambulate as well as negotiate stairs.    Baseline  113 degrees seated R knee flexion AROM (08/30/2019)    Time  6    Period  Weeks    Status  New    Target Date  10/13/19      PT LONG TERM GOAL #3   Title  Pt will improve bilateral hip extension and abduction strength by at least 1/2 MMT strength to promote ability ambulate with less R lateral knee pain.    Baseline  hip extension 4/5 R,  4-/5 L, hip abduction  4-/5 R and L (08/30/2019)    Time  6    Period  Weeks    Status  New    Target Date  10/13/19            Plan - 09/05/19 1033    Clinical Impression Statement  Pt demonstrates  R LE neural tension in which treatment to promote neural flossing as well as improving R glute muscle use helped decrease R lateral knee pain during gait and improved R knee flexion during swing phase. Pt will benefit from continued skilled physical therapy services to decrease pain, improve ROM, strength, function and decrease difficulty with gait.    Personal Factors and Comorbidities  Age    Examination-Activity Limitations  Squat;Stairs    Stability/Clinical Decision Making  Stable/Uncomplicated    Rehab Potential  Fair    PT Frequency  2x / week    PT Duration  6 weeks    PT Treatment/Interventions  Aquatic Therapy;Electrical Stimulation;Iontophoresis 4mg /ml Dexamethasone;Ultrasound;Gait training;Stair training;Therapeutic activities;Therapeutic exercise;Neuromuscular re-education;Patient/family education;Manual techniques;Dry needling    PT Next Visit Plan  hip and knee strengthening, femoral control, manual techniques, modalities PRN    Consulted and Agree with Plan of Care  Patient       Patient will benefit from skilled therapeutic intervention in order to improve the following deficits and impairments:  Pain, Postural dysfunction, Improper body mechanics, Difficulty walking, Decreased strength, Decreased range of motion, Abnormal gait  Visit Diagnosis: Acute pain of right knee  Muscle weakness (generalized)  Difficulty in walking, not elsewhere classified     Problem List Patient Active Problem List   Diagnosis Date Noted  . Vaginal atrophy 07/09/2018  . Routine general medical examination at a health care facility 02/22/2014  . Encounter for routine gynecological examination 02/22/2014    Joneen Boers PT, DPT   09/05/2019, 12:22 PM  [place]  PHYSICAL AND SPORTS MEDICINE [address], [place], [address] Phone: [phone]   Fax:  [phone]  Name: Lenya Sterne MRN: [id number] Date of Birth: 04-08-1956

## 2019-09-13 ENCOUNTER — Ambulatory Visit: Payer: 59

## 2019-09-13 ENCOUNTER — Other Ambulatory Visit: Payer: Self-pay

## 2019-09-13 DIAGNOSIS — R262 Difficulty in walking, not elsewhere classified: Secondary | ICD-10-CM | POA: Diagnosis not present

## 2019-09-13 DIAGNOSIS — M25561 Pain in right knee: Secondary | ICD-10-CM | POA: Diagnosis not present

## 2019-09-13 DIAGNOSIS — M6281 Muscle weakness (generalized): Secondary | ICD-10-CM | POA: Diagnosis not present

## 2019-09-13 NOTE — Therapy (Signed)
[facility] [address], [place], [address] Phone: [phone]   Fax:  [phone]  Physical Therapy Treatment  Patient Details  Name: Amy Sims MRN: [id number] Date of Birth: 07-04-1956 Referring Provider (PT): Laurice Record. Hooten, MD   Encounter Date: 09/13/2019  PT End of Session - 09/13/19 1007    Visit Number  3    Number of Visits  13    Date for PT Re-Evaluation  10/13/19    PT Start Time  0952    PT Stop Time  1035    PT Time Calculation (min)  43 min    Activity Tolerance  Patient tolerated treatment well    Behavior During Therapy  [facility] for tasks assessed/performed       Past Medical History:  Diagnosis Date  . Generalized headaches   . 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 KNEE ARTHROSCOPY Right 07/13/2019   Procedure: ARTHROSCOPY KNEE, Removeal of chondral lose body, medial menisectomy;  Surgeon: Dereck Leep, MD;  Location: [facility] ORS;  Service: Orthopedics;  Laterality: Right;  . ROTATOR CUFF REPAIR Right   . TOOTH EXTRACTION      There were no vitals filed for this visit.  Subjective Assessment - 09/13/19 0952    Subjective  Went to work for 2 days. R knee bothered her after 4-5 hours. Pt can handle it. Still has tightness, on R lateral knee.    Pertinent History  S/P R knee arthroscopy 07/13/2019 with menisectomy and loose body removal.  Main problem is mobility. R knee feels stiff, especially on the lateral side. started her home exercises from her surgeon a week after her surgery. Has been doing them 2x a day. Has not yet had PT for her knee pain or since after the surgery.   Going back to work 09/06/2019, Pt does a lot of standing and walking. Has time to sit down as well. Works in the NICU.  Currently unable to kneel down. Has not been to work since her surgery. Started driving 3 weeks since the surgery. Prior to her  surgery, around June 11, 2019 pt got out of bed and could not walk. Was painful, went to the ER, was negative for DVT. Saw her MD and who thought that the problem was due to the loose body.   Had an MRI which showed a meniscal tear. Her surgery was delayed due to [place]. Has a difficult time bending her knee since the injury.  Currently uses a [facility] for long distances for safety. Has 15 steps to go up to her bed room. Going up the steps is easier but going down the steps is harder (walks backwards to go down steps).    Patient Stated Goals  No pain, decrease R lateral knee tightness.    Currently in Pain?  No/denies    Pain Score  0-No pain    Pain Onset  More than a month ago                               PT Education - 09/13/19 1006    Education provided  Yes    Education Details  ther-ex    [employer]) Educated  Patient    Methods  Explanation;Demonstration;Tactile cues;Verbal cues    Comprehension  Returned demonstration;Verbalized understanding  Objective   Right knee arthroscopy, partial medial meniscectomy, chondroplasty, and removal of a chondral loose bodyOn 07/13/2019   S/P R knee arthroscopy 07/13/2019 with menisectomy and loose body removal.   No latex band allergies  9 weeks post op      Manual therapy     Prone STM R lateral hamstrings to decrease tension     Therapeutic exercise   Supine hamstrings stretch   R 10x3  supine bridge 10x3  Improved R knee flexion during swing phase after aforementioned treatment  Standing B ankle DF/PF on Rockerboard with B UE assist 2 minutes  Standing hip machine, height 1   Hip extension    R plate 25 for 90W4   L plate 10 for 5x. Difficult with compensation.   Standing mini forward lunge with L UE assist 10x3 to promote R glute max strengthening  Standing mini R lateral lunge with one UE assist 10x  Slight R knee discomfort with gait afterwards   Improved exercise  technique, movement at target joints, use of target muscles after mod verbal, visual, tactile cues.      Response to treatment Good muscle use felt with exercises. Decreased R knee pain with swing phase and heel strike to foot flat following treatment to improve R glute max muscle strength and improving R LE sciatic neural mobility.    Clinical impression Continued working on R glute max strengthening and R neural mobility to promote R knee flexion during swing phase of gait. Pt tolerated session well without aggravation of symptoms. Pt will benefit from continued skilled physical therapy services to decrease pain, improve ROM, strength, function, and decrease difficulty with gait.      PT Short Term Goals - 08/30/19 1550      PT SHORT TERM GOAL #1   Title  Patient will be independent with her HEP to decrease R knee pain, improve LE strength, function, and decrease difficulty ambulating.    Time  3    Period  Weeks    Status  New    Target Date  09/22/19        PT Long Term Goals - 08/30/19 1551      PT LONG TERM GOAL #1   Title  Patient will have a decrease in R knee pain to 2/10 at worst to decrease difficulty with gait as well as improve ability to lift items from lower surfaces.    Baseline  4/10 R knee pain at most for the past 3 weeks (08/30/2019)    Time  6    Period  Weeks    Status  New    Target Date  10/13/19      PT LONG TERM GOAL #2   Title  Patient will improve R knee flexion AROM to 125 degrees or more to promote ability to ambulate as well as negotiate stairs.    Baseline  113 degrees seated R knee flexion AROM (08/30/2019)    Time  6    Period  Weeks    Status  New    Target Date  10/13/19      PT LONG TERM GOAL #3   Title  Pt will improve bilateral hip extension and abduction strength by at least 1/2 MMT strength to promote ability ambulate with less R lateral knee pain.    Baseline  hip extension 4/5 R, 4-/5 L, hip abduction 4-/5 R and L  (08/30/2019)    Time  6    Period  Weeks  Status  New    Target Date  10/13/19            Plan - 09/13/19 1010    Clinical Impression Statement  Continued working on R glute max strengthening and R neural mobility to promote R knee flexion during swing phase of gait. Pt tolerated session well without aggravation of symptoms. Pt will benefit from continued skilled physical therapy services to decrease pain, improve ROM, strength, function, and decrease difficulty with gait.    Personal Factors and Comorbidities  Age    Examination-Activity Limitations  Squat;Stairs    Stability/Clinical Decision Making  Stable/Uncomplicated    Rehab Potential  Fair    PT Frequency  2x / week    PT Duration  6 weeks    PT Treatment/Interventions  Aquatic Therapy;Electrical Stimulation;Iontophoresis 4mg /ml Dexamethasone;Ultrasound;Gait training;Stair training;Therapeutic activities;Therapeutic exercise;Neuromuscular re-education;Patient/family education;Manual techniques;Dry needling    PT Next Visit Plan  hip and knee strengthening, femoral control, manual techniques, modalities PRN    Consulted and Agree with Plan of Care  Patient       Patient will benefit from skilled therapeutic intervention in order to improve the following deficits and impairments:  Pain, Postural dysfunction, Improper body mechanics, Difficulty walking, Decreased strength, Decreased range of motion, Abnormal gait  Visit Diagnosis: Acute pain of right knee  Muscle weakness (generalized)  Difficulty in walking, not elsewhere classified     Problem List Patient Active Problem List   Diagnosis Date Noted  . Vaginal atrophy 07/09/2018  . Routine general medical examination at a health care facility 02/22/2014  . Encounter for routine gynecological examination 02/22/2014    Loralyn FreshwaterMiguel Laporshia Hogen PT, DPT   09/13/2019, 1:04 PM  [facility] [address], [place], [address] Phone: [phone]   Fax:  [phone]  Name: Amy Sims MRN: [id number] Date of Birth: 14-Jun-1956

## 2019-12-14 DIAGNOSIS — H2513 Age-related nuclear cataract, bilateral: Secondary | ICD-10-CM | POA: Diagnosis not present

## 2019-12-14 DIAGNOSIS — H5210 Myopia, unspecified eye: Secondary | ICD-10-CM | POA: Diagnosis not present

## 2020-03-09 DIAGNOSIS — M25562 Pain in left knee: Secondary | ICD-10-CM | POA: Diagnosis not present

## 2020-03-09 DIAGNOSIS — G8929 Other chronic pain: Secondary | ICD-10-CM | POA: Diagnosis not present

## 2020-03-09 DIAGNOSIS — M170 Bilateral primary osteoarthritis of knee: Secondary | ICD-10-CM | POA: Diagnosis not present

## 2020-04-12 IMAGING — MR MRI OF THE RIGHT KNEE WITHOUT CONTRAST
7 series · 40 of 40 positions shown · non-contrast
Comparison: Plain films of the right knee 04/16/2019

CLINICAL DATA: Onset right knee pain 04/13/2019.  No known injury.

EXAM:
MRI OF THE RIGHT KNEE WITHOUT CONTRAST
TECHNIQUE: Multiplanar, multisequence MR imaging of the knee was performed. No
intravenous contrast was administered.

[Series 8: T2 fat-sat · axial · right · 4.0mm · 0.50mm/px · z∈[-36,+87]mm · 7 of 26 slices shown (1 of 3)]
[im 1/26]
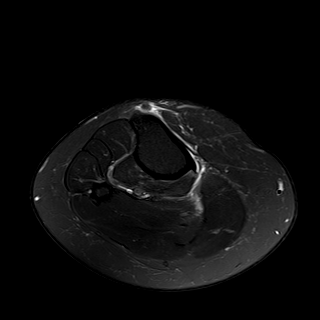
[im 5/26]
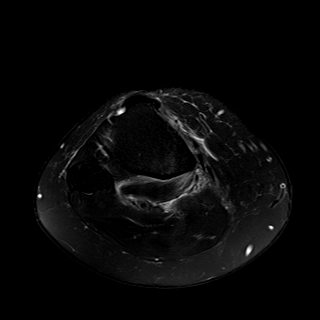
[im 9/26]
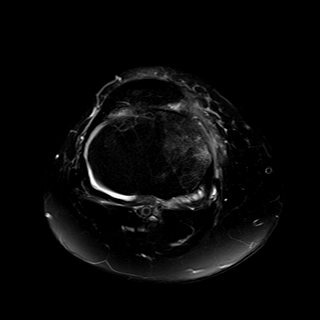
[im 13/26]
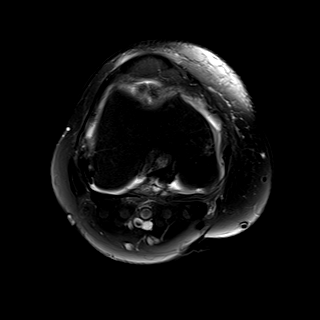
[im 17/26]
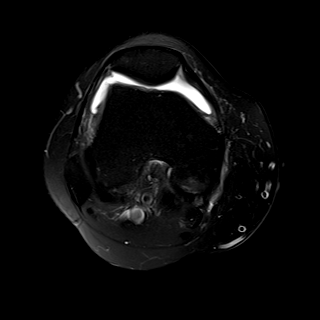
[im 21/26]
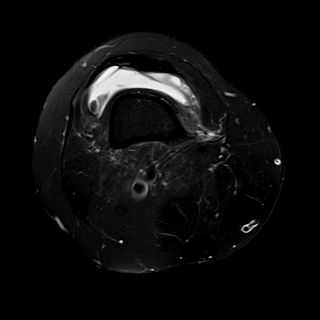
[im 26/26]
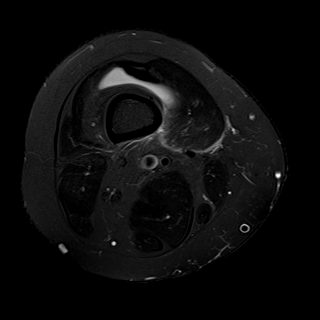

[Series 9: T1 · coronal · right · 4.0mm · 0.59mm/px · 6 of 28 slices shown]
[im 1/28]
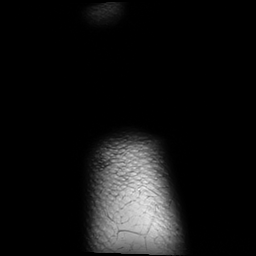
[im 6/28]
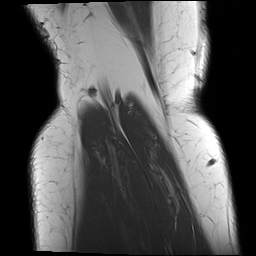
[im 11/28]
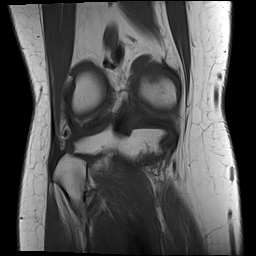
[im 17/28]
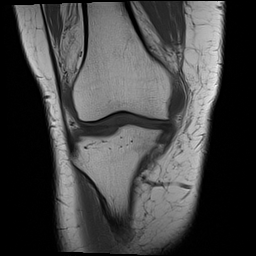
[im 22/28]
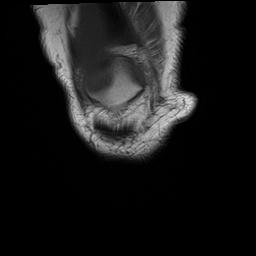
[im 28/28]
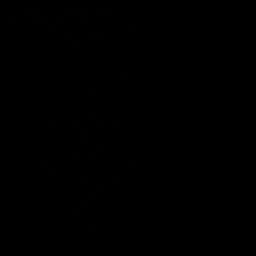

[Series 10: T2 fat-sat · coronal · right · 4.0mm · 0.59mm/px · 6 of 28 slices shown (2 of 3)]
[im 1/28]
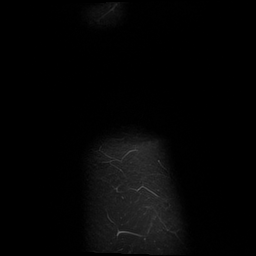
[im 6/28]
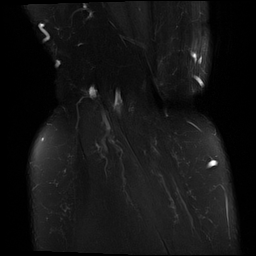
[im 11/28]
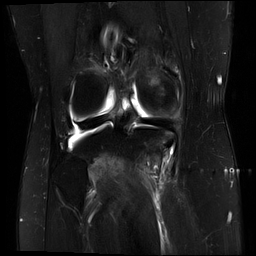
[im 17/28]
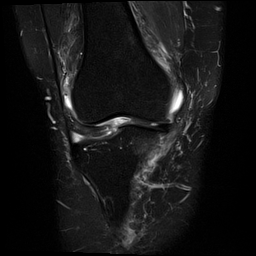
[im 22/28]
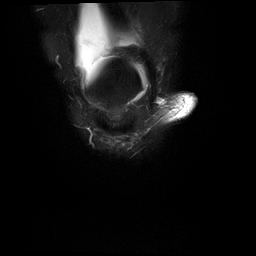
[im 28/28]
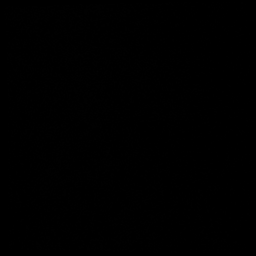

[Series 11: PD fat-sat · coronal · right · 4.0mm · 0.59mm/px · 6 of 28 slices shown (1 of 2)]
[im 1/28]
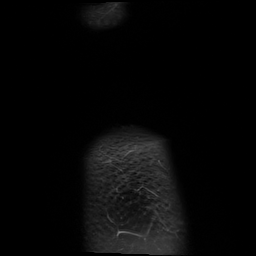
[im 6/28]
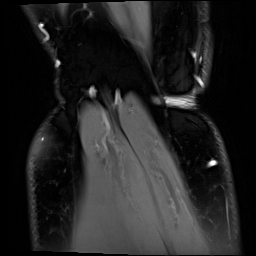
[im 11/28]
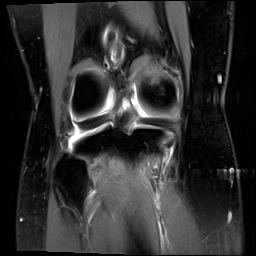
[im 17/28]
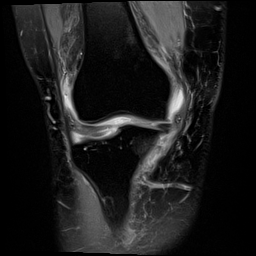
[im 22/28]
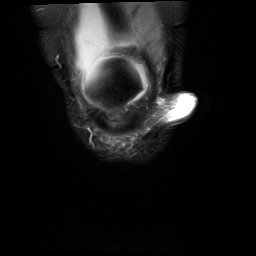
[im 28/28]
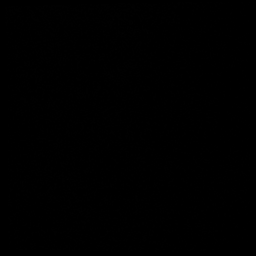

[Series 12: PD fat-sat · sagittal · right · 3.0mm · 0.59mm/px · 6 of 28 slices shown (2 of 2)]
[im 1/28]
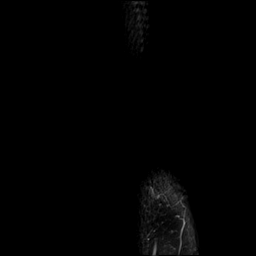
[im 6/28]
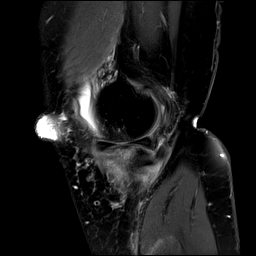
[im 11/28]
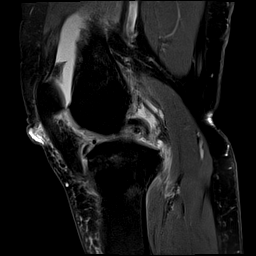
[im 17/28]
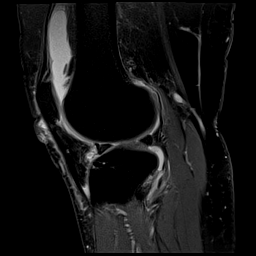
[im 22/28]
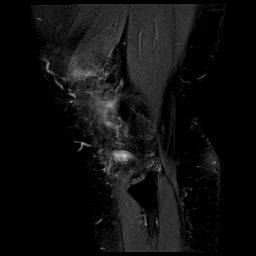
[im 28/28]
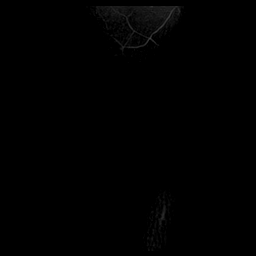

[Series 13: T2 fat-sat · sagittal · right · 3.0mm · 0.59mm/px · 7 of 34 slices shown (3 of 3)]
[im 1/34]
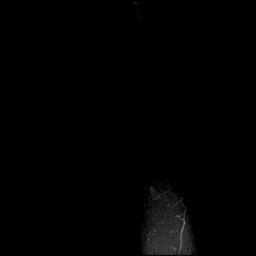
[im 6/34]
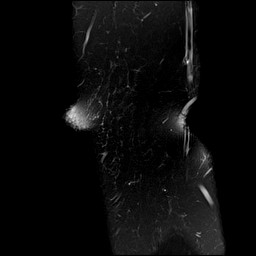
[im 12/34]
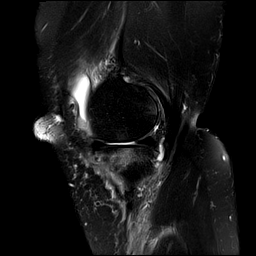
[im 17/34]
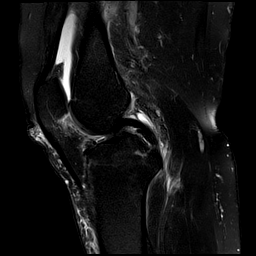
[im 23/34]
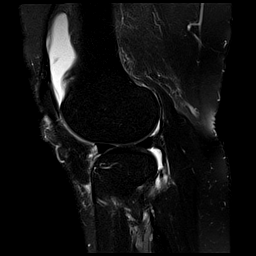
[im 28/34]
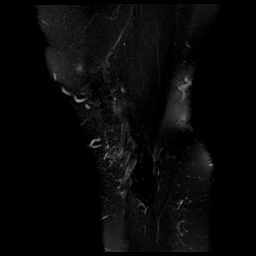
[im 34/34]
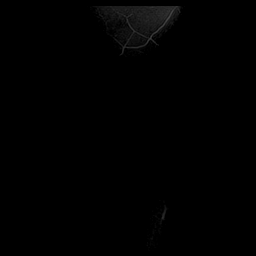

[Series 14: PD · oblique · right · 2.0mm · 0.47mm/px · 2 of 10 slices shown]
[im 1/10]
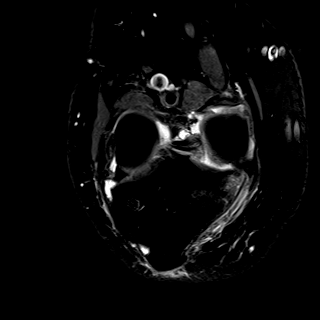
[im 10/10]
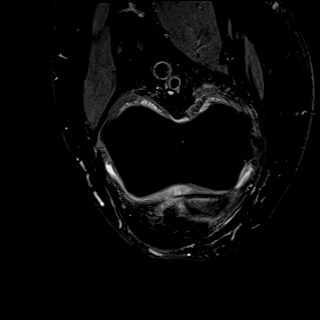

[40 of 40 positions shown; findings below may reference images not displayed]

FINDINGS: MENISCI

Medial meniscus: Complete radial tear through the posterior horn is
identified.

Lateral meniscus:  Intact.

LIGAMENTS

Cruciates:  Intact.

Collaterals:  Intact.

CARTILAGE

Patellofemoral:  Minimally degenerated.

Medial: Full-thickness thinning of cartilage on the weight-bearing
surface of the medial femoral condyle measures approximately 1.5 cm
AP x 0.5 cm transverse.

Lateral:  Preserved.

Joint:  Small joint effusion.

Popliteal Fossa:  No Baker's cyst.

Extensor Mechanism:  Intact.

Bones: There is some marrow edema about the periphery of the medial
compartment, more notable in the tibia. No fracture.

Other: None.
IMPRESSION: Complete radial tear root of the posterior horn of the medial
meniscus.

Moderate medial compartment osteoarthritis with associated
subchondral edema about the periphery of the medial compartment.
Minimal patellofemoral degenerative change noted.

## 2020-12-14 DIAGNOSIS — H2513 Age-related nuclear cataract, bilateral: Secondary | ICD-10-CM | POA: Diagnosis not present

## 2020-12-14 DIAGNOSIS — H5213 Myopia, bilateral: Secondary | ICD-10-CM | POA: Diagnosis not present

## 2021-08-24 DIAGNOSIS — Z20822 Contact with and (suspected) exposure to covid-19: Secondary | ICD-10-CM | POA: Diagnosis not present

## 2021-12-05 DIAGNOSIS — H2513 Age-related nuclear cataract, bilateral: Secondary | ICD-10-CM | POA: Diagnosis not present

## 2021-12-05 DIAGNOSIS — H5213 Myopia, bilateral: Secondary | ICD-10-CM | POA: Diagnosis not present

## 2022-02-14 ENCOUNTER — Encounter: Payer: 59 | Admitting: Primary Care

## 2022-04-04 ENCOUNTER — Encounter: Payer: 59 | Admitting: Primary Care

## 2022-04-22 ENCOUNTER — Other Ambulatory Visit (HOSPITAL_COMMUNITY)
Admission: RE | Admit: 2022-04-22 | Discharge: 2022-04-22 | Disposition: A | Discharge status: Home / Self Care | Payer: 59 | Source: Ambulatory Visit | Attending: Primary Care | Admitting: Primary Care

## 2022-04-22 ENCOUNTER — Encounter: Payer: Self-pay | Admitting: Primary Care

## 2022-04-22 ENCOUNTER — Ambulatory Visit (INDEPENDENT_AMBULATORY_CARE_PROVIDER_SITE_OTHER): Payer: 59 | Admitting: Primary Care

## 2022-04-22 VITALS — BP 124/62 | HR 79 | Temp 98.7°F | Ht 59.0 in | Wt 133.0 lb

## 2022-04-22 DIAGNOSIS — Z0001 Encounter for general adult medical examination with abnormal findings: Secondary | ICD-10-CM

## 2022-04-22 DIAGNOSIS — M1990 Unspecified osteoarthritis, unspecified site: Secondary | ICD-10-CM | POA: Diagnosis not present

## 2022-04-22 DIAGNOSIS — Z124 Encounter for screening for malignant neoplasm of cervix: Secondary | ICD-10-CM | POA: Insufficient documentation

## 2022-04-22 DIAGNOSIS — R0609 Other forms of dyspnea: Secondary | ICD-10-CM | POA: Diagnosis not present

## 2022-04-22 DIAGNOSIS — Z1211 Encounter for screening for malignant neoplasm of colon: Secondary | ICD-10-CM

## 2022-04-22 DIAGNOSIS — Z1231 Encounter for screening mammogram for malignant neoplasm of breast: Secondary | ICD-10-CM | POA: Diagnosis not present

## 2022-04-22 LAB — LIPID PANEL
Cholesterol: 194 mg/dL (ref 0–200)
HDL: 52.8 mg/dL (ref >39.00)
LDL Cholesterol: 113 mg/dL — ABNORMAL HIGH (ref 0–99)
NonHDL: 141.32
Total CHOL/HDL Ratio: 4
Triglycerides: 144 mg/dL (ref 0.0–149.0)
VLDL: 28.8 mg/dL (ref 0.0–40.0)

## 2022-04-22 LAB — COMPREHENSIVE METABOLIC PANEL
ALT: 13 U/L (ref 0–35)
AST: 17 U/L (ref 0–37)
Albumin: 4.1 g/dL (ref 3.5–5.2)
Alkaline Phosphatase: 82 U/L (ref 39–117)
BUN: 19 mg/dL (ref 6–23)
CO2: 27 mEq/L (ref 19–32)
Calcium: 9.2 mg/dL (ref 8.4–10.5)
Chloride: 106 mEq/L (ref 96–112)
Creatinine, Ser: 0.85 mg/dL (ref 0.40–1.20)
GFR: 72.3 mL/min (ref >60.00)
Glucose, Bld: 93 mg/dL (ref 70–99)
Potassium: 4.1 mEq/L (ref 3.5–5.1)
Sodium: 140 mEq/L (ref 135–145)
Total Bilirubin: 0.4 mg/dL (ref 0.2–1.2)
Total Protein: 7.1 g/dL (ref 6.0–8.3)

## 2022-04-22 LAB — CBC
HCT: 39.9 % (ref 36.0–46.0)
Hemoglobin: 13.2 g/dL (ref 12.0–15.0)
MCHC: 33 g/dL (ref 30.0–36.0)
MCV: 86.9 fl (ref 78.0–100.0)
Platelets: 245 10*3/uL (ref 150.0–400.0)
RBC: 4.59 Mil/uL (ref 3.87–5.11)
RDW: 14.5 % (ref 11.5–15.5)
WBC: 6.4 10*3/uL (ref 4.0–10.5)

## 2022-04-22 LAB — HEMOGLOBIN A1C: Hgb A1c MFr Bld: 6 % (ref 4.6–6.5)

## 2022-04-22 LAB — TSH: TSH: 1.81 u[IU]/mL (ref 0.35–5.50)

## 2022-04-22 NOTE — Assessment & Plan Note (Signed)
Declines Shingrix vaccines despite recommendations.  Other vaccines up-to-date. ? ?Colonoscopy overdue, she declines colonoscopy but does opt for Cologuard.  We will send Cologuard order. ? ?Mammogram overdue, orders placed. ?Pap smear due, completed today. ? ?Discussed the importance of a healthy diet and regular exercise in order for weight loss, and to reduce the risk of further co-morbidity. ? ?Exam today as noted. ?Labs pending ?

## 2022-04-22 NOTE — Assessment & Plan Note (Signed)
Chronic. ? ?Continue intermittent use of Meloxicam 15 mg.  ?

## 2022-04-22 NOTE — Progress Notes (Signed)
? ?Subjective:  ? ? Patient ID: Amy Sims, female    DOB: 10/08/1956, 66 y.o.   MRN: [id number] ? ?HPI ? ?Amy Sims is a very pleasant 66 y.o. female who presents today to re-establish care, for complete physical and follow up of chronic conditions. ? ?I have not seen patient since March 2020, nor has she been seen at any [place] facility since March 2020. ? ?She would also like to mention shortness of breath. Intermittent for the last 3-5 months. She only noticed shortness of breath with moderate exertion walking around her neighborhood. She denies SOB with walking upstairs, walking at work. She denies chest pain, lower extremity edema, long travel. She has never smoked. No prior diagnosis of asthma.  ? ?Wt Readings from Last 3 Encounters:  ?04/22/22 133 lb (60.3 kg)  ?07/08/19 135 lb (61.2 kg)  ?04/16/19 133 lb (60.3 kg)  ? ? ? ?Immunizations: ?-Tetanus: Unsure, thinks it's updated ?-Influenza: Completed last season  ?-Covid-19: 2 vaccines  ?-Shingles: Never completed  ? ?Diet: Fair diet.  ?Exercise: No regular exercise. Active at work.  ? ?Eye exam: Completes annually  ?Dental exam: Completes semi-annually  ? ?Pap Smear: Completed in 2019 ?Mammogram: Completed several years ago ?Colonoscopy: Never completed, declines. Opts for Cologuard.  ? ?BP Readings from Last 3 Encounters:  ?04/22/22 124/62  ?07/13/19 (!) 141/80  ?07/08/19 118/78  ? ? ? ? ? ?Review of Systems  ?Constitutional:  Negative for unexpected weight change.  ?HENT:  Negative for rhinorrhea.   ?Respiratory:  Positive for shortness of breath. Negative for cough.   ?Cardiovascular:  Negative for chest pain.  ?Gastrointestinal:  Negative for constipation and diarrhea.  ?Genitourinary:  Negative for difficulty urinating.  ?Musculoskeletal:  Positive for arthralgias. Negative for myalgias.  ?Skin:  Negative for rash.  ?Allergic/Immunologic: Negative for environmental allergies.  ?Neurological:  Negative for dizziness, numbness and headaches.   ?Psychiatric/Behavioral:  The patient is not nervous/anxious.   ? ?   ? ? ?Past Medical History:  ?Diagnosis Date  ? Generalized headaches   ? Positive TB test   ? had cxr which denied TB  ? ? ?Social History  ? ?Socioeconomic History  ? Marital status: Married  ?  Spouse name: Not on file  ? Number of children: Not on file  ? Years of education: Not on file  ? Highest education level: Not on file  ?Occupational History  ? Not on file  ?Tobacco Use  ? Smoking status: Never  ? Smokeless tobacco: Never  ?Vaping Use  ? Vaping Use: Never used  ?Substance and Sexual Activity  ? Alcohol use: No  ? Drug use: No  ? Sexual activity: Yes  ?Other Topics Concern  ? Not on file  ?Social History Narrative  ? 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  ? Married.  ? 3 children.  ? ?Social Determinants of Health  ? ?Financial Resource Strain: Not on file  ?Food Insecurity: Not on file  ?Transportation Needs: Not on file  ?Physical Activity: Not on file  ?Stress: Not on file  ?Social Connections: Not on file  ?Intimate Partner Violence: Not on file  ? ? ?Past Surgical History:  ?Procedure Laterality Date  ? KNEE ARTHROSCOPY Right 07/13/2019  ? Procedure: ARTHROSCOPY KNEE, Removeal of chondral lose body, medial menisectomy;  Surgeon: Donato Heinz, MD;  Location: [facility] ORS;  Service: Orthopedics;  Laterality: Right;  ? ROTATOR CUFF REPAIR Right   ? TOOTH EXTRACTION    ? ? ?Family History  ?  Problem Relation Age of Onset  ? Hypertension Mother   ? Stroke Mother   ? Cancer Sister 6  ?     non hodgkins lymphoma  ? ? ?No Known Allergies ? ?Current Outpatient Medications on File Prior to Visit  ?Medication Sig Dispense Refill  ? meloxicam (MOBIC) 15 MG tablet Take 15 mg by mouth daily.    ? ?No current facility-administered medications on file prior to visit.  ? ? ?BP 124/62   Pulse 79   Temp 98.7 ?F (37.1 ?C) (Oral)   Ht 4\' 11"  (1.499 m)   Wt 133 lb (60.3 kg)   SpO2 97%   BMI 26.86 kg/m?  ?Objective:  ? Physical Exam ?Exam conducted with a  chaperone present.  ?HENT:  ?   Right Ear: Tympanic membrane and ear canal normal.  ?   Left Ear: Tympanic membrane and ear canal normal.  ?   Nose: Nose normal.  ?Eyes:  ?   Conjunctiva/sclera: Conjunctivae normal.  ?   Pupils: Pupils are equal, round, and reactive to light.  ?Neck:  ?   Thyroid: No thyromegaly.  ?Cardiovascular:  ?   Rate and Rhythm: Normal rate and regular rhythm.  ?   Heart sounds: No murmur heard. ?Pulmonary:  ?   Effort: Pulmonary effort is normal.  ?   Breath sounds: Normal breath sounds. No rales.  ?Abdominal:  ?   General: Bowel sounds are normal.  ?   Palpations: Abdomen is soft.  ?   Tenderness: There is no abdominal tenderness.  ?Genitourinary: ?   Labia:     ?   Right: No tenderness or lesion.     ?   Left: No tenderness or lesion.   ?   Vagina: Normal.  ?   Cervix: No cervical motion tenderness, discharge or erythema.  ?   Uterus: Normal.   ?   Adnexa: Right adnexa normal and left adnexa normal.  ?Musculoskeletal:     ?   General: Normal range of motion.  ?   Cervical back: Neck supple.  ?Lymphadenopathy:  ?   Cervical: No cervical adenopathy.  ?Skin: ?   General: Skin is warm and dry.  ?   Findings: No rash.  ?Neurological:  ?   Mental Status: She is alert and oriented to person, place, and time.  ?   Cranial Nerves: No cranial nerve deficit.  ?   Deep Tendon Reflexes: Reflexes are normal and symmetric.  ?Psychiatric:     ?   Mood and Affect: Mood normal.  ? ? ? ? ? ?   ?Assessment & Plan:  ? ? ? ? ?This visit occurred during the SARS-CoV-2 public health emergency.  Safety protocols were in place, including screening questions prior to the visit, additional usage of staff PPE, and extensive cleaning of exam room while observing appropriate contact time as indicated for disinfecting solutions.  ?

## 2022-04-22 NOTE — Patient Instructions (Signed)
Stop by the lab prior to leaving today. I will notify you of your results once received.  ? ?Call the [facility] to schedule your mammogram.  ? ?Complete the Cologuard kit for colon cancer screening. ? ?It was a pleasure to see you today! ? ?Preventive Care 26-65 Years Old, Female Old, Female, Female ?Preventive care refers to lifestyle choices and visits with your health care provider that can promote health and wellness. Preventive care visits are also called wellness exams. ?What can I expect for my preventive care visit? ?Counseling ?Your health care provider may ask you questions about your: ?Medical history, including: ?Past medical problems. ?Family medical history. ?Pregnancy history. ?Current health, including: ?Menstrual cycle. ?Method of birth control. ?Emotional well-being. ?Home life and relationship well-being. ?Sexual activity and sexual health. ?Lifestyle, including: ?Alcohol, nicotine or tobacco, and drug use. ?Access to firearms. ?Diet, exercise, and sleep habits. ?Work and work Statistician. ?Sunscreen use. ?Safety issues such as seatbelt and bike helmet use. ?Physical exam ?Your health care provider will check your: ?Height and weight. These may be used to calculate your BMI (body mass index). BMI is a measurement that tells if you are at a healthy weight. ?Waist circumference. This measures the distance around your waistline. This measurement also tells if you are at a healthy weight and may help predict your risk of certain diseases, such as type 2 diabetes and high blood pressure. ?Heart rate and blood pressure. ?Body temperature. ?Skin for abnormal spots. ?What immunizations do I need? ? ?Vaccines are usually given at various ages, according to a schedule. Your health care provider will recommend vaccines for you based on your age, medical history, and lifestyle or other factors, such as travel or where you work. ?What tests do I need? ?Screening ?Your health care provider may recommend screening tests for  certain conditions. This may include: ?Lipid and cholesterol levels. ?Diabetes screening. This is done by checking your blood sugar (glucose) after you have not eaten for a while (fasting). ?Pelvic exam and Pap test. ?Hepatitis B test. ?Hepatitis C test. ?HIV (human immunodeficiency virus) test. ?STI (sexually transmitted infection) testing, if you are at risk. ?Lung cancer screening. ?Colorectal cancer screening. ?Mammogram. Talk with your health care provider about when you should start having regular mammograms. This may depend on whether you have a family history of breast cancer. ?BRCA-related cancer screening. This may be done if you have a family history of breast, ovarian, tubal, or peritoneal cancers. ?Bone density scan. This is done to screen for osteoporosis. ?Talk with your health care provider about your test results, treatment options, and if necessary, the need for more tests. ?Follow these instructions at home: ?Eating and drinking ? ?Eat a diet that includes fresh fruits and vegetables, whole grains, lean protein, and low-fat dairy products. ?Take vitamin and mineral supplements as recommended by your health care provider. ?Do not drink alcohol if: ?Your health care provider tells you not to drink. ?You are pregnant, may be pregnant, or are planning to become pregnant. ?If you drink alcohol: ?Limit how much you have to 0-1 drink a day. ?Know how much alcohol is in your drink. In the [place] one drink equals one 12 oz bottle of beer (355 mL), one 5 oz glass of wine (148 mL), or one 1? oz glass of hard liquor (44 mL). ?Lifestyle ?Brush your teeth every morning and night with fluoride toothpaste. Floss one time each day. ?Exercise for at least 30 minutes 5 or more days each week. ?Do not use any products that  contain nicotine or tobacco. These products include cigarettes, chewing tobacco, and vaping devices, such as e-cigarettes. If you need help quitting, ask your health care provider. ?Do not use  drugs. ?If you are sexually active, practice safe sex. Use a condom or other form of protection to prevent STIs. ?If you do not wish to become pregnant, use a form of birth control. If you plan to become pregnant, see your health care provider for a prepregnancy visit. ?Take aspirin only as told by your health care provider. Make sure that you understand how much to take and what form to take. Work with your health care provider to find out whether it is safe and beneficial for you to take aspirin daily. ?Find healthy ways to manage stress, such as: ?Meditation, yoga, or listening to music. ?Journaling. ?Talking to a trusted person. ?Spending time with friends and family. ?Minimize exposure to UV radiation to reduce your risk of skin cancer. ?Safety ?Always wear your seat belt while driving or riding in a vehicle. ?Do not drive: ?If you have been drinking alcohol. Do not ride with someone who has been drinking. ?When you are tired or distracted. ?While texting. ?If you have been using any mind-altering substances or drugs. ?Wear a helmet and other protective equipment during sports activities. ?If you have firearms in your house, make sure you follow all gun safety procedures. ?Seek help if you have been physically or sexually abused. ?What's next? ?Visit your health care provider once a year for an annual wellness visit. ?Ask your health care provider how often you should have your eyes and teeth checked. ?Stay up to date on all vaccines. ?This information is not intended to replace advice given to you by your health care provider. Make sure you discuss any questions you have with your health care provider. ?Document Revised: 05/29/2021 Document Reviewed: 05/29/2021 ?Elsevier Patient Education ? [place]. ? ?

## 2022-04-22 NOTE — Assessment & Plan Note (Addendum)
Only occurs with moderate exertion. ?No other symptoms. ? ?Non-smoker, no history of asthma, lungs on exam today clear.  ? ?Checking labs today.  ?ECG today with normal sinus rhythm, rate of 65.  No PAC/PVC, no acute ST changes.  No old ECG to compare. ? ?Consider cardiology evaluation if labs are unremarkable. ? ?UPDATE: ?Labs unremarkable.  We will update patient and recommend cardiology evaluation. ? ? ?

## 2022-04-24 LAB — CYTOLOGY - PAP
Comment: NEGATIVE
Diagnosis: NEGATIVE
High risk HPV: NEGATIVE

## 2022-04-25 ENCOUNTER — Other Ambulatory Visit: Payer: Self-pay | Admitting: Primary Care

## 2022-04-25 DIAGNOSIS — R0609 Other forms of dyspnea: Secondary | ICD-10-CM

## 2022-06-03 NOTE — Progress Notes (Unsigned)
Cardiology Office Note  Date:  06/04/2022   ID:  Amy Sims, DOB [birth year], MRN [id number]  PCP:  Amy Nest, NP   Chief Complaint  Patient presents with   New Patient (Initial Visit)    Ref by Dr. Chestine Spore for exertional dyspnea. Medications reviewed by the patient verbally.     HPI:  Amy Sims is a 66 y.o. female with no prior cardiac history  Referred by Amy Sims for consultation of her exertional dyspnea  Per notes from PMD: shortness of breath. Intermittent for the last 3-5 months. She only noticed shortness of breath with moderate exertion walking around her neighborhood. She denies SOB with walking upstairs, walking at work. She denies chest pain, lower extremity edema, long travel. She has never smoked. No prior diagnosis of asthma.   On further discussion of her symptoms she reports that in mid April 2023 she developed some left arm discomfort around the biceps area  Separately she had shortness of breath when walking up a hill near where she lives.  Shortness of breath symptoms will get better walking down a hill Currently she is walking for exercise. Can walk 1 mile  Again continues to have shortness of breath up the hill  She is not a diabetic (A1c 6.0, borderline), non-smoker, reasonable cholesterol 180-190 monotherapy  Family hx: Mother 42s with HTN, CHF Denies premature coronary disease  EKG personally reviewed by myself on todays visit Normal sinus rhythm rate 64 bpm no significant ST-T wave changes  PMH:   has a past medical history of Generalized headaches and Positive TB test.  PSH:    Past Surgical History:  Procedure Laterality Date   KNEE ARTHROSCOPY Right 07/13/2019   Procedure: ARTHROSCOPY KNEE, Removeal of chondral lose body, medial menisectomy;  Surgeon: Sims Heinz, MD;  Location: [facility] ORS;  Service: Orthopedics;  Laterality: Right;   ROTATOR CUFF REPAIR Right    TOOTH EXTRACTION      Current Outpatient Medications   Medication Sig Dispense Refill   meloxicam (MOBIC) 15 MG tablet Take 15 mg by mouth daily as needed.     No current facility-administered medications for this visit.     Allergies:   Patient has no known allergies.   Social History:  The patient  reports that she has never smoked. She has never used smokeless tobacco. She reports that she does not drink alcohol and does not use drugs.   Family History:   family history includes Cancer (age of onset: 55) in her sister; Hypertension in her mother; Stroke in her mother.    Review of Systems: Review of Systems  Constitutional: Negative.   HENT: Negative.    Respiratory:  Positive for shortness of breath.   Cardiovascular: Negative.   Gastrointestinal: Negative.   Musculoskeletal: Negative.   Neurological: Negative.   Psychiatric/Behavioral: Negative.    All other systems reviewed and are negative.   PHYSICAL EXAM: VS:  BP 130/90 (BP Location: Right Arm, Patient Position: Sitting, Cuff Size: Normal)   Pulse 64   Ht 4\' 11"  (1.499 m)   Wt 137 lb 8 oz (62.4 kg)   SpO2 98%   BMI 27.77 kg/m  , BMI Body mass index is 27.77 kg/m. GEN: Well nourished, well developed, in no acute distress HEENT: normal Neck: no JVD, carotid bruits, or masses Cardiac: RRR; no murmurs, rubs, or gallops,no edema  Respiratory:  clear to auscultation bilaterally, normal work of breathing GI: soft, nontender, nondistended, + BS MS: no deformity or atrophy  Skin: warm and dry, no rash Neuro:  Strength and sensation are intact Psych: euthymic mood, full affect   Recent Labs: 04/22/2022: ALT 13; BUN 19; Creatinine, Ser 0.85; Hemoglobin 13.2; Platelets 245.0; Potassium 4.1; Sodium 140; TSH 1.81    Lipid Panel Lab Results  Component Value Date   CHOL 194 04/22/2022   HDL 52.80 04/22/2022   LDLCALC 113 (H) 04/22/2022   TRIG 144.0 04/22/2022      Wt Readings from Last 3 Encounters:  06/04/22 137 lb 8 oz (62.4 kg)  04/22/22 133 lb (60.3 kg)   07/08/19 135 lb (61.2 kg)      ASSESSMENT AND PLAN:  Problem List Items Addressed This Visit   None Visit Diagnoses     Dyspnea on exertion    -  Primary   Relevant Orders   EKG 12-Lead   Arm pain, anterior, left       Relevant Orders   EKG 12-Lead      Shortness of breath/arm pain Etiology unclear, unable to exclude cardiac etiology versus other (including conditioning).  Recommend echocardiogram for further cardiac evaluation Few risk factors for underlying ischemia though this should be considered Recommend we start with echo and if symptoms persist could consider calcium scoring for risk stratification versus cardiac CTA No active chest pain or arm pain, only having shortness of breath on hills at this time Recommend she continue her exercise program  Elevated blood pressure without diagnosis of hypertension Rushing into the office today, mildly elevated blood pressure but typically is well controlled 2 medications made    Total encounter time more than 45 minutes  Greater than 50% was spent in counseling and coordination of care with the patient    Signed, Dossie Arbour, M.D., Ph.D. [facility] [place], [place] [phone]

## 2022-06-04 ENCOUNTER — Encounter: Payer: Self-pay | Admitting: Cardiovascular Disease

## 2022-06-04 ENCOUNTER — Ambulatory Visit: Payer: 59 | Admitting: Cardiovascular Disease

## 2022-06-04 VITALS — BP 130/90 | HR 64 | Ht 59.0 in | Wt 137.5 lb

## 2022-06-04 DIAGNOSIS — M79602 Pain in left arm: Secondary | ICD-10-CM

## 2022-06-04 DIAGNOSIS — R0609 Other forms of dyspnea: Secondary | ICD-10-CM

## 2022-06-04 NOTE — Patient Instructions (Addendum)
Medication Instructions:  No changes  If you need a refill on your cardiac medications before your next appointment, please call your pharmacy.   Lab work: No new labs needed  Testing/Procedures:  Your physician has requested that you have an echocardiogram. Echocardiography is a painless test that uses sound waves to create images of your heart. It provides your doctor with information about the size and shape of your heart and how well your heart's chambers and valves are working. This procedure takes approximately one hour. There are no restrictions for this procedure.   Follow-Up: At [facility], you and your health needs are our priority.  As part of our continuing mission to provide you with exceptional heart care, we have created designated Provider Care Teams.  These Care Teams include your primary Cardiologist (physician) and Advanced Practice Providers (APPs -  Physician Assistants and Nurse Practitioners) who all work together to provide you with the care you need, when you need it.  You will need a follow up appointment as needed  Providers on your designated Care Team:   Christopher Berge, NP Ryan Dunn, PA-C Cadence Furth, PA-C  COVID-19 Vaccine Information can be found at: [email or web address] For questions related to vaccine distribution or appointments, please email [email or web address] or call [phone].   

## 2022-06-05 ENCOUNTER — Telehealth: Payer: Self-pay | Admitting: Cardiovascular Disease

## 2022-06-05 NOTE — Telephone Encounter (Signed)
Jennifer notified, is calling pt back now.

## 2022-06-05 NOTE — Telephone Encounter (Signed)
Patient called back to ask to speak with Victorino Dike. Please advise

## 2022-06-11 ENCOUNTER — Other Ambulatory Visit: Payer: 59

## 2022-06-25 ENCOUNTER — Ambulatory Visit (INDEPENDENT_AMBULATORY_CARE_PROVIDER_SITE_OTHER): Payer: 59

## 2022-06-25 DIAGNOSIS — R0609 Other forms of dyspnea: Secondary | ICD-10-CM

## 2022-06-26 LAB — ECHOCARDIOGRAM COMPLETE
AR max vel: 2.34 cm2
AV Area VTI: 2.4 cm2
AV Area mean vel: 2.31 cm2
AV Mean grad: 2 mmHg
AV Peak grad: 3.7 mmHg
Ao pk vel: 0.96 m/s
Area-P 1/2: 4.26 cm2
Calc EF: 53.5 %
S' Lateral: 2.3 cm
Single Plane A2C EF: 53.1 %
Single Plane A4C EF: 54.4 %

## 2022-10-29 DIAGNOSIS — Z1211 Encounter for screening for malignant neoplasm of colon: Secondary | ICD-10-CM | POA: Diagnosis not present

## 2022-11-06 LAB — COLOGUARD: COLOGUARD: NEGATIVE

## 2022-11-21 ENCOUNTER — Other Ambulatory Visit: Payer: Self-pay | Admitting: Family Medicine

## 2022-11-21 ENCOUNTER — Ambulatory Visit: Payer: 59 | Admitting: Family Medicine

## 2022-11-21 ENCOUNTER — Encounter: Payer: Self-pay | Admitting: Family Medicine

## 2022-11-21 VITALS — BP 129/85 | HR 73 | Temp 97.6°F | Ht 59.0 in | Wt 136.5 lb

## 2022-11-21 DIAGNOSIS — I10 Essential (primary) hypertension: Secondary | ICD-10-CM

## 2022-11-21 DIAGNOSIS — R519 Headache, unspecified: Secondary | ICD-10-CM

## 2022-11-21 DIAGNOSIS — Z8249 Family history of ischemic heart disease and other diseases of the circulatory system: Secondary | ICD-10-CM | POA: Diagnosis not present

## 2022-11-21 LAB — CBC WITH DIFFERENTIAL/PLATELET
Basophils Absolute: 0.1 10*3/uL (ref 0.0–0.1)
Basophils Relative: 0.9 % (ref 0.0–3.0)
Eosinophils Absolute: 0.2 10*3/uL (ref 0.0–0.7)
Eosinophils Relative: 3.1 % (ref 0.0–5.0)
HCT: 39.8 % (ref 36.0–46.0)
Hemoglobin: 13.3 g/dL (ref 12.0–15.0)
Lymphocytes Relative: 42.2 % (ref 12.0–46.0)
Lymphs Abs: 2.5 10*3/uL (ref 0.7–4.0)
MCHC: 33.3 g/dL (ref 30.0–36.0)
MCV: 85.8 fl (ref 78.0–100.0)
Monocytes Absolute: 0.4 10*3/uL (ref 0.1–1.0)
Monocytes Relative: 7.4 % (ref 3.0–12.0)
Neutro Abs: 2.7 10*3/uL (ref 1.4–7.7)
Neutrophils Relative %: 46.4 % (ref 43.0–77.0)
Platelets: 258 10*3/uL (ref 150.0–400.0)
RBC: 4.65 Mil/uL (ref 3.87–5.11)
RDW: 14.1 % (ref 11.5–15.5)
WBC: 5.8 10*3/uL (ref 4.0–10.5)

## 2022-11-21 LAB — COMPREHENSIVE METABOLIC PANEL
ALT: 11 U/L (ref 0–35)
AST: 15 U/L (ref 0–37)
Albumin: 4.2 g/dL (ref 3.5–5.2)
Alkaline Phosphatase: 92 U/L (ref 39–117)
BUN: 18 mg/dL (ref 6–23)
CO2: 29 mEq/L (ref 19–32)
Calcium: 9.5 mg/dL (ref 8.4–10.5)
Chloride: 105 mEq/L (ref 96–112)
Creatinine, Ser: 0.79 mg/dL (ref 0.40–1.20)
GFR: 78.61 mL/min (ref >60.00)
Glucose, Bld: 96 mg/dL (ref 70–99)
Potassium: 4.2 mEq/L (ref 3.5–5.1)
Sodium: 140 mEq/L (ref 135–145)
Total Bilirubin: 0.3 mg/dL (ref 0.2–1.2)
Total Protein: 7.2 g/dL (ref 6.0–8.3)

## 2022-11-21 LAB — TSH: TSH: 2.86 u[IU]/mL (ref 0.35–5.50)

## 2022-11-21 MED ORDER — HYDROCHLOROTHIAZIDE 25 MG PO TABS: 25.0000 mg | ORAL_TABLET | Freq: Every day | ORAL | 3 refills | Status: DC

## 2022-11-21 NOTE — Progress Notes (Signed)
Patient ID: Amy Sims, female    DOB: 12/01/1956, 66 y.o.   MRN: [id number]  This visit was conducted in person.  BP 129/85   Pulse 73   Temp 97.6 F (36.4 C) (Oral)   Ht 4\' 11"  (1.499 m)   Wt 136 lb 8 oz (61.9 kg)   SpO2 97%   BMI 27.57 kg/m    CC:  Chief Complaint  Patient presents with   Headache    On top of head when she gets up Also having pressue on the back of her eye x 3 days   Hypertension    Subjective:   HPI: Amy Sims is a 66 y.o. female  patient of 77 presenting on 11/21/2022 for Headache (On top of head when she gets up/Also having pressue on the back of her eye x 3 days) and Hypertension   No history of HTN.   BP Readings from Last 3 Encounters:  11/21/22 129/85  06/04/22 130/90  04/22/22 124/62   New daily headache in last 2 months, intractable since last 2 weeks, worsening.  Noted headache on top of head,  noted mainly with waking in AM. Dull ache. 5-6/10  Since October BP  has been higher 130-152/81-100, HTR 62-87   Now with pressure behind eyes and nausea  in last 3 days. No vomiting. NO Cp. No numbness, no weakness, no new neuro changes.   2 Uncles and 1 aunt with cerebral aneursyms.November    died age 25-52   She takes meloxicam off and on.. has not taken any in last 3 weeks.  No weight gain, no  change in slt, no ETOH intake. No head injury lately.   No peripheral swelling.   Reviewed last office visit note with PCP as well as previous labs and imaging.  Relevant past medical, surgical, family and social history reviewed and updated as indicated. Interim medical history since our last visit reviewed. Allergies and medications reviewed and updated. Outpatient Medications Prior to Visit  Medication Sig Dispense Refill   meloxicam (MOBIC) 15 MG tablet Take 15 mg by mouth daily as needed.     No facility-administered medications prior to visit.      Per HPI unless specifically indicated in ROS section below Review of  Systems  Constitutional:  Negative for fatigue and fever.  HENT:  Negative for congestion.   Eyes:  Negative for pain.  Respiratory:  Negative for cough and shortness of breath.   Cardiovascular:  Negative for chest pain, palpitations and leg swelling.  Gastrointestinal:  Negative for abdominal pain.  Genitourinary:  Negative for dysuria and vaginal bleeding.  Musculoskeletal:  Negative for back pain.  Neurological:  Positive for headaches. Negative for syncope and light-headedness.  Psychiatric/Behavioral:  Negative for dysphoric mood.    Objective:  BP 129/85   Pulse 73   Temp 97.6 F (36.4 C) (Oral)   Ht 4\' 11"  (1.499 m)   Wt 136 lb 8 oz (61.9 kg)   SpO2 97%   BMI 27.57 kg/m   Wt Readings from Last 3 Encounters:  11/21/22 136 lb 8 oz (61.9 kg)  06/04/22 137 lb 8 oz (62.4 kg)  04/22/22 133 lb (60.3 kg)      Physical Exam Constitutional:      General: She is not in acute distress.    Appearance: Normal appearance. She is well-developed. She is not ill-appearing or toxic-appearing.  HENT:     Head: Normocephalic.     Right Ear: Hearing,  tympanic membrane, ear canal and external ear normal. Tympanic membrane is not erythematous, retracted or bulging.     Left Ear: Hearing, tympanic membrane, ear canal and external ear normal. Tympanic membrane is not erythematous, retracted or bulging.     Nose: No mucosal edema or rhinorrhea.     Right Sinus: No maxillary sinus tenderness or frontal sinus tenderness.     Left Sinus: No maxillary sinus tenderness or frontal sinus tenderness.     Mouth/Throat:     Pharynx: Uvula midline.  Eyes:     General: Lids are normal. Lids are everted, no foreign bodies appreciated.     Conjunctiva/sclera: Conjunctivae normal.     Pupils: Pupils are equal, round, and reactive to light.  Neck:     Thyroid: No thyroid mass or thyromegaly.     Vascular: No carotid bruit.     Trachea: Trachea normal.  Cardiovascular:     Rate and Rhythm: Normal rate  and regular rhythm.     Pulses: Normal pulses.     Heart sounds: Normal heart sounds, S1 normal and S2 normal. No murmur heard.    No friction rub. No gallop.  Pulmonary:     Effort: Pulmonary effort is normal. No tachypnea or respiratory distress.     Breath sounds: Normal breath sounds. No decreased breath sounds, wheezing, rhonchi or rales.  Abdominal:     General: Bowel sounds are normal.     Palpations: Abdomen is soft.     Tenderness: There is no abdominal tenderness.  Musculoskeletal:     Cervical back: Normal range of motion and neck supple.  Skin:    General: Skin is warm and dry.     Findings: No rash.  Neurological:     Mental Status: She is alert and oriented to person, place, and time.     GCS: GCS eye subscore is 4. GCS verbal subscore is 5. GCS motor subscore is 6.     Cranial Nerves: No cranial nerve deficit.     Sensory: No sensory deficit.     Motor: No abnormal muscle tone.     Coordination: Coordination normal.     Gait: Gait normal.     Deep Tendon Reflexes: Reflexes are normal and symmetric.     Comments: Nml cerebellar exam   No papilledema  Psychiatric:        Mood and Affect: Mood is not anxious or depressed.        Speech: Speech normal.        Behavior: Behavior normal. Behavior is cooperative.        Thought Content: Thought content normal.        Cognition and Memory: Memory is not impaired. She does not exhibit impaired recent memory or impaired remote memory.        Judgment: Judgment normal.       Results for orders placed or performed in visit on 06/25/22  ECHOCARDIOGRAM COMPLETE  Result Value Ref Range   AR max vel 2.34 cm2   AV Peak grad 3.7 mmHg   Ao pk vel 0.96 m/s   S' Lateral 2.30 cm   Area-P 1/2 4.26 cm2   AV Area VTI 2.40 cm2   AV Mean grad 2.0 mmHg   Single Plane A4C EF 54.4 %   Single Plane A2C EF 53.1 %   Calc EF 53.5 %   AV Area mean vel 2.31 cm2     COVID 19 screen:  No recent travel or known  exposure to COVID19 The  patient denies respiratory symptoms of COVID 19 at this time. The importance of social distancing was discussed today.   Assessment and Plan Acute intractable headache, unspecified headache type Assessment & Plan: Evaluate for causes with lab work.  Orders: -     MR BRAIN W WO CONTRAST; Future  HTN (hypertension), benign Assessment & Plan: Blood pressure has been elevated over the last several months associated with headache. Will evaluate for secondary causes. She will work on heart healthy lifestyle and follow blood pressure at home.  If it remains above goal we will start a medication.  Orders: -     MR BRAIN W WO CONTRAST; Future -     Comprehensive metabolic panel; Future -     TSH; Future -     CBC with Differential/Platelet; Future  Family history of cerebral aneurysm Assessment & Plan: Multiple family members with history of cerebral aneurysm.  In setting of worsening headache new in the last 2 months we will order an MRI to rule out intracranial issue.  Orders: -     MR BRAIN W WO CONTRAST; Future       Kerby Nora, MD

## 2022-12-03 ENCOUNTER — Ambulatory Visit
Admission: RE | Admit: 2022-12-03 | Discharge: 2022-12-03 | Disposition: A | Discharge status: Home / Self Care | Payer: 59 | Source: Ambulatory Visit | Attending: Family Medicine | Admitting: Family Medicine

## 2022-12-03 DIAGNOSIS — I10 Essential (primary) hypertension: Secondary | ICD-10-CM | POA: Insufficient documentation

## 2022-12-03 DIAGNOSIS — Z8249 Family history of ischemic heart disease and other diseases of the circulatory system: Secondary | ICD-10-CM | POA: Insufficient documentation

## 2022-12-03 DIAGNOSIS — R519 Headache, unspecified: Secondary | ICD-10-CM | POA: Insufficient documentation

## 2022-12-03 MED ORDER — GADOBUTROL 1 MMOL/ML IV SOLN
6.0000 mL | Freq: Once | INTRAVENOUS | Status: AC | PRN
  Administered 2022-12-03: 6 mL via INTRAVENOUS

## 2022-12-04 DIAGNOSIS — H5213 Myopia, bilateral: Secondary | ICD-10-CM | POA: Diagnosis not present

## 2022-12-04 DIAGNOSIS — H2513 Age-related nuclear cataract, bilateral: Secondary | ICD-10-CM | POA: Diagnosis not present

## 2022-12-26 NOTE — Assessment & Plan Note (Signed)
Multiple family members with history of cerebral aneurysm.  In setting of worsening headache new in the last 2 months we will order an MRI to rule out intracranial issue.

## 2022-12-26 NOTE — Assessment & Plan Note (Signed)
Evaluate for causes with lab work.

## 2022-12-26 NOTE — Assessment & Plan Note (Signed)
Blood pressure has been elevated over the last several months associated with headache. Will evaluate for secondary causes. She will work on heart healthy lifestyle and follow blood pressure at home.  If it remains above goal we will start a medication.

## 2023-12-01 ENCOUNTER — Ambulatory Visit: Payer: Commercial Managed Care - PPO | Admitting: Family Medicine

## 2023-12-01 ENCOUNTER — Encounter: Payer: Self-pay | Admitting: Family Medicine

## 2023-12-01 VITALS — BP 142/80 | HR 71 | Temp 98.1°F | Ht <= 58 in | Wt 137.0 lb

## 2023-12-01 DIAGNOSIS — L659 Nonscarring hair loss, unspecified: Secondary | ICD-10-CM | POA: Diagnosis not present

## 2023-12-01 DIAGNOSIS — Z1159 Encounter for screening for other viral diseases: Secondary | ICD-10-CM | POA: Diagnosis not present

## 2023-12-01 DIAGNOSIS — Z131 Encounter for screening for diabetes mellitus: Secondary | ICD-10-CM | POA: Diagnosis not present

## 2023-12-01 DIAGNOSIS — Z8249 Family history of ischemic heart disease and other diseases of the circulatory system: Secondary | ICD-10-CM

## 2023-12-01 DIAGNOSIS — Z13220 Encounter for screening for lipoid disorders: Secondary | ICD-10-CM | POA: Diagnosis not present

## 2023-12-01 DIAGNOSIS — I10 Essential (primary) hypertension: Secondary | ICD-10-CM

## 2023-12-01 LAB — COMPREHENSIVE METABOLIC PANEL
ALT: 13 U/L (ref 0–35)
AST: 17 U/L (ref 0–37)
Albumin: 4.1 g/dL (ref 3.5–5.2)
Alkaline Phosphatase: 87 U/L (ref 39–117)
BUN: 19 mg/dL (ref 6–23)
CO2: 26 meq/L (ref 19–32)
Calcium: 9.3 mg/dL (ref 8.4–10.5)
Chloride: 105 meq/L (ref 96–112)
Creatinine, Ser: 0.91 mg/dL (ref 0.40–1.20)
GFR: 65.87 mL/min (ref >60.00)
Glucose, Bld: 87 mg/dL (ref 70–99)
Potassium: 4.1 meq/L (ref 3.5–5.1)
Sodium: 138 meq/L (ref 135–145)
Total Bilirubin: 0.4 mg/dL (ref 0.2–1.2)
Total Protein: 6.9 g/dL (ref 6.0–8.3)

## 2023-12-01 LAB — LIPID PANEL
Cholesterol: 195 mg/dL (ref 0–200)
HDL: 59.2 mg/dL (ref >39.00)
LDL Cholesterol: 118 mg/dL — ABNORMAL HIGH (ref 0–99)
NonHDL: 135.86
Total CHOL/HDL Ratio: 3
Triglycerides: 87 mg/dL (ref 0.0–149.0)
VLDL: 17.4 mg/dL (ref 0.0–40.0)

## 2023-12-01 LAB — TSH: TSH: 2.46 u[IU]/mL (ref 0.35–5.50)

## 2023-12-01 LAB — HEMOGLOBIN A1C: Hgb A1c MFr Bld: 6.2 % (ref 4.6–6.5)

## 2023-12-01 MED ORDER — METOPROLOL SUCCINATE ER 25 MG PO TB24: 25.0000 mg | ORAL_TABLET | Freq: Every day | ORAL | 3 refills | Status: DC

## 2023-12-01 NOTE — Assessment & Plan Note (Signed)
Chronic, discussed other possible blood pressure measurements but given she is tolerating metoprolol without side effects and per patient good control of blood pressure, we will continue this medication for now.  Metoprolol XL 25 mg p.o. daily  She is overdue for her physical and yearly lab evaluation.  This was ordered today.

## 2023-12-01 NOTE — Progress Notes (Signed)
Patient ID: Amy Sims, female    DOB: 1956-10-23, 67 y.o.   MRN: [id number]  This visit was conducted in person.  BP (!) 142/80   Pulse 71   Temp 98.1 F (36.7 C) (Temporal)   Ht 4' 8.75" (1.441 m)   Wt 137 lb (62.1 kg)   SpO2 97%   BMI 29.91 kg/m    CC:  Chief Complaint  Patient presents with   Medication Refill    Took hydrochlorothiazide for 3 months, noticed this medication was not helping. She went back home to see physician and was given metoprolol 25 mg and has been taking this for 9 months.     Subjective:   HPI: Amy Sims is a 67 y.o. female patient of Amy Sims presenting on 12/01/2023 for  Chief Complaint  Patient presents with   Medication Refill    Took hydrochlorothiazide for 3 months, noticed this medication was not helping. She went back home to see physician and was given metoprolol 25 mg and has been taking this for 9 months.     Hypertension:   She was seen by myself last year with BP elevation.Marland Kitchen lab work up negative  She reports no improvement with hydrochlorothiazide.  When went home to  [place].. started on metoprolol XL 25 mg... has been controlling BP for last 9 months.  NO SE. BP Readings from Last 3 Encounters:  12/01/23 (!) 142/80  11/21/22 129/85  06/04/22 130/90  Using medication without problems or lightheadedness:  none Chest pain with exertion: none Edema: none Short of breath: none Average home BPs: 130/80s Other issues:   Mom with HTN  Brother with PAD      Relevant past medical, surgical, family and social history reviewed and updated as indicated. Interim medical history since our last visit reviewed. Allergies and medications reviewed and updated. Outpatient Medications Prior to Visit  Medication Sig Dispense Refill   meloxicam (MOBIC) 15 MG tablet Take 15 mg by mouth as needed for pain.     metoprolol tartrate (LOPRESSOR) 25 MG tablet Take 25 mg by mouth 2 (two) times daily.     hydrochlorothiazide  (HYDRODIURIL) 25 MG tablet Take 1 tablet (25 mg total) by mouth daily. (Patient not taking: Reported on 12/01/2023) 90 tablet 3   No facility-administered medications prior to visit.     Per HPI unless specifically indicated in ROS section below Review of Systems  Constitutional:  Negative for fatigue, fever and unexpected weight change.  HENT:  Negative for congestion, ear pain, sinus pressure, sneezing, sore throat and trouble swallowing.   Eyes:  Negative for pain and itching.  Respiratory:  Negative for cough, shortness of breath and wheezing.   Cardiovascular:  Negative for chest pain, palpitations and leg swelling.  Gastrointestinal:  Negative for abdominal pain, blood in stool, constipation, diarrhea and nausea.  Genitourinary:  Negative for difficulty urinating, dysuria, hematuria, menstrual problem and vaginal discharge.  Skin:  Negative for rash.  Neurological:  Negative for syncope, weakness, light-headedness, numbness and headaches.  Psychiatric/Behavioral:  Negative for confusion and dysphoric mood. The patient is not nervous/anxious.    Objective:  BP (!) 142/80   Pulse 71   Temp 98.1 F (36.7 C) (Temporal)   Ht 4' 8.75" (1.441 m)   Wt 137 lb (62.1 kg)   SpO2 97%   BMI 29.91 kg/m   Wt Readings from Last 3 Encounters:  12/01/23 137 lb (62.1 kg)  11/21/22 136 lb 8 oz (61.9 kg)  06/04/22  137 lb 8 oz (62.4 kg)      Physical Exam Constitutional:      General: She is not in acute distress.    Appearance: Normal appearance. She is well-developed. She is not ill-appearing or toxic-appearing.  HENT:     Head: Normocephalic.     Right Ear: Hearing, tympanic membrane, ear canal and external ear normal. Tympanic membrane is not erythematous, retracted or bulging.     Left Ear: Hearing, tympanic membrane, ear canal and external ear normal. Tympanic membrane is not erythematous, retracted or bulging.     Nose: No mucosal edema or rhinorrhea.     Right Sinus: No maxillary  sinus tenderness or frontal sinus tenderness.     Left Sinus: No maxillary sinus tenderness or frontal sinus tenderness.     Mouth/Throat:     Pharynx: Uvula midline.  Eyes:     General: Lids are normal. Lids are everted, no foreign bodies appreciated.     Conjunctiva/sclera: Conjunctivae normal.     Pupils: Pupils are equal, round, and reactive to light.  Neck:     Thyroid: No thyroid mass or thyromegaly.     Vascular: No carotid bruit.     Trachea: Trachea normal.  Cardiovascular:     Rate and Rhythm: Normal rate and regular rhythm.     Pulses: Normal pulses.     Heart sounds: Normal heart sounds, S1 normal and S2 normal. No murmur heard.    No friction rub. No gallop.  Pulmonary:     Effort: Pulmonary effort is normal. No tachypnea or respiratory distress.     Breath sounds: Normal breath sounds. No decreased breath sounds, wheezing, rhonchi or rales.  Abdominal:     General: Bowel sounds are normal.     Palpations: Abdomen is soft.     Tenderness: There is no abdominal tenderness.  Musculoskeletal:     Cervical back: Normal range of motion and neck supple.  Skin:    General: Skin is warm and dry.     Findings: No rash.  Neurological:     Mental Status: She is alert.  Psychiatric:        Mood and Affect: Mood is not anxious or depressed.        Speech: Speech normal.        Behavior: Behavior normal. Behavior is cooperative.        Thought Content: Thought content normal.        Judgment: Judgment normal.       Results for orders placed or performed in visit on 11/21/22  CBC with Differential/Platelet   Collection Time: 11/21/22 10:32 AM  Result Value Ref Range   WBC 5.8 4.0 - 10.5 K/uL   RBC 4.65 3.87 - 5.11 Mil/uL   Hemoglobin 13.3 12.0 - 15.0 g/dL   HCT 78.2 95.6 - 21.3 %   MCV 85.8 78.0 - 100.0 fl   MCHC 33.3 30.0 - 36.0 g/dL   RDW 08.6 57.8 - 46.9 %   Platelets 258.0 150.0 - 400.0 K/uL   Neutrophils Relative % 46.4 43.0 - 77.0 %   Lymphocytes Relative 42.2  12.0 - 46.0 %   Monocytes Relative 7.4 3.0 - 12.0 %   Eosinophils Relative 3.1 0.0 - 5.0 %   Basophils Relative 0.9 0.0 - 3.0 %   Neutro Abs 2.7 1.4 - 7.7 K/uL   Lymphs Abs 2.5 0.7 - 4.0 K/uL   Monocytes Absolute 0.4 0.1 - 1.0 K/uL   Eosinophils Absolute 0.2  0.0 - 0.7 K/uL   Basophils Absolute 0.1 0.0 - 0.1 K/uL  TSH   Collection Time: 11/21/22 10:32 AM  Result Value Ref Range   TSH 2.86 0.35 - 5.50 uIU/mL  Comprehensive metabolic panel   Collection Time: 11/21/22 10:32 AM  Result Value Ref Range   Sodium 140 135 - 145 mEq/L   Potassium 4.2 3.5 - 5.1 mEq/L   Chloride 105 96 - 112 mEq/L   CO2 29 19 - 32 mEq/L   Glucose, Bld 96 70 - 99 mg/dL   BUN 18 6 - 23 mg/dL   Creatinine, Ser 9.62 0.40 - 1.20 mg/dL   Total Bilirubin 0.3 0.2 - 1.2 mg/dL   Alkaline Phosphatase 92 39 - 117 U/L   AST 15 0 - 37 U/L   ALT 11 0 - 35 U/L   Total Protein 7.2 6.0 - 8.3 g/dL   Albumin 4.2 3.5 - 5.2 g/dL   GFR 95.28 >41.32 mL/min   Calcium 9.5 8.4 - 10.5 mg/dL    Assessment and Plan  Screening cholesterol level -     Lipid panel -     Comprehensive metabolic panel  HTN (hypertension), benign Assessment & Plan: Chronic, discussed other possible blood pressure measurements but given she is tolerating metoprolol without side effects and per patient good control of blood pressure, we will continue this medication for now.  Metoprolol XL 25 mg p.o. daily  She is overdue for her physical and yearly lab evaluation.  This was ordered today.   Screening for diabetes mellitus -     Hemoglobin A1c  Need for hepatitis C screening test -     Hepatitis C antibody  Alopecia -     TSH  Family history of early CAD Assessment & Plan: Briefly discussed her increased risk of heart disease given strong family history of peripheral artery disease and coronary artery disease.  She would likely benefit from calcium CT scoring.  Will start with lipid panel and have her speak with her PCP about this in more  detail.   Other orders -     Metoprolol Succinate ER; Take 1 tablet (25 mg total) by mouth daily.  Dispense: 90 tablet; Refill: 3    Return in about 6 months (around 05/31/2024) for annual physical no labs  WITH KATE PCP.   Kerby Nora, MD

## 2023-12-01 NOTE — Assessment & Plan Note (Signed)
Briefly discussed her increased risk of heart disease given strong family history of peripheral artery disease and coronary artery disease.  She would likely benefit from calcium CT scoring.  Will start with lipid panel and have her speak with her PCP about this in more detail.

## 2023-12-01 NOTE — Patient Instructions (Signed)
Please stop at the lab to have labs drawn.  

## 2023-12-02 LAB — HEPATITIS C ANTIBODY: Hepatitis C Ab: NONREACTIVE

## 2023-12-04 DIAGNOSIS — H2513 Age-related nuclear cataract, bilateral: Secondary | ICD-10-CM | POA: Diagnosis not present

## 2023-12-04 DIAGNOSIS — H5213 Myopia, bilateral: Secondary | ICD-10-CM | POA: Diagnosis not present

## 2024-02-09 ENCOUNTER — Other Ambulatory Visit: Payer: Self-pay

## 2024-02-10 ENCOUNTER — Other Ambulatory Visit: Payer: Self-pay

## 2024-02-10 MED ORDER — METOPROLOL SUCCINATE ER 25 MG PO TB24
25.0000 mg | ORAL_TABLET | Freq: Every day | ORAL | 3 refills | Status: DC
  Filled 2024-02-10: qty 90, 90d supply, fill #0
  Filled 2024-05-09: qty 90, 90d supply, fill #1

## 2024-02-11 ENCOUNTER — Other Ambulatory Visit: Payer: Self-pay

## 2024-02-18 ENCOUNTER — Other Ambulatory Visit: Payer: Self-pay

## 2024-02-18 DIAGNOSIS — Z1231 Encounter for screening mammogram for malignant neoplasm of breast: Secondary | ICD-10-CM

## 2024-02-19 ENCOUNTER — Ambulatory Visit
Admission: RE | Admit: 2024-02-19 | Discharge: 2024-02-19 | Disposition: A | Discharge status: Home / Self Care | Source: Ambulatory Visit | Attending: Primary Care | Admitting: Primary Care

## 2024-02-19 DIAGNOSIS — Z1231 Encounter for screening mammogram for malignant neoplasm of breast: Secondary | ICD-10-CM

## 2024-04-28 ENCOUNTER — Other Ambulatory Visit: Payer: Self-pay

## 2024-04-28 MED ORDER — SODIUM FLUORIDE 1.1 % DT CREA
TOPICAL_CREAM | Freq: Every day | DENTAL | 0 refills | Status: AC
Start: 2024-04-27 — End: ?
  Filled 2024-04-28: qty 51, 30d supply, fill #0

## 2024-05-10 ENCOUNTER — Other Ambulatory Visit: Payer: Self-pay

## 2024-05-31 ENCOUNTER — Encounter: Payer: Self-pay | Admitting: Primary Care

## 2024-05-31 ENCOUNTER — Ambulatory Visit (INDEPENDENT_AMBULATORY_CARE_PROVIDER_SITE_OTHER): Payer: Commercial Managed Care - PPO | Admitting: Primary Care

## 2024-05-31 VITALS — BP 118/72 | HR 66 | Temp 97.2°F | Ht <= 58 in | Wt 136.0 lb

## 2024-05-31 DIAGNOSIS — Z8249 Family history of ischemic heart disease and other diseases of the circulatory system: Secondary | ICD-10-CM | POA: Diagnosis not present

## 2024-05-31 DIAGNOSIS — Z0000 Encounter for general adult medical examination without abnormal findings: Secondary | ICD-10-CM | POA: Diagnosis not present

## 2024-05-31 DIAGNOSIS — I10 Essential (primary) hypertension: Secondary | ICD-10-CM | POA: Diagnosis not present

## 2024-05-31 DIAGNOSIS — E2839 Other primary ovarian failure: Secondary | ICD-10-CM | POA: Diagnosis not present

## 2024-05-31 NOTE — Assessment & Plan Note (Signed)
 Declines pneumonia vaccine. Discussed Shingrix vaccines for pharmacy Mammogram UTD. Bone density scan due, orders placed. Colonoscopy never completed, Cologuard due in 2026  Discussed the importance of a healthy diet and regular exercise in order for weight loss, and to reduce the risk of further co-morbidity.  Exam stable. Labs pending.  Follow up in 1 year for repeat physical.

## 2024-05-31 NOTE — Progress Notes (Signed)
 Subjective:    Amy Sims is a 68 y.o. female who presents for a Welcome to Medicare exam.   She would also like a coronary calcium CT scan. She has a family history of early CAD and a personal history of hyperlipidemia and hypertension.   Review of Systems  Constitutional:  Negative for fever.  HENT:  Negative for sore throat.   Respiratory:  Negative for cough.   Cardiovascular:  Negative for chest pain.  Gastrointestinal:  Negative for constipation and diarrhea.  Genitourinary:  Negative for frequency.  Musculoskeletal:  Positive for joint pain.  Skin:  Negative for rash.  Neurological:  Negative for dizziness and headaches.  Endo/Heme/Allergies:  Negative for polydipsia.  Psychiatric/Behavioral:  The patient is not nervous/anxious.            Immunizations:  -Shingles: Never completed  -Pneumonia: Never completed, declines   Mammogram: Completed in March 2025  Colonoscopy: Completed Cologuard in 2023 Dexa: Never completed   BP Readings from Last 3 Encounters:  05/31/24 118/72  12/01/23 (!) 142/80  11/21/22 129/85      Objective:    Today's Vitals   05/31/24 0902  BP: 118/72  Pulse: 66  Temp: (!) 97.2 F (36.2 C)  TempSrc: Temporal  SpO2: 97%  Weight: 136 lb (61.7 kg)  Height: 4' 8.75 (1.441 m)  Body mass index is 29.69 kg/m.  Medications Outpatient Encounter Medications as of 05/31/2024  Medication Sig   sodium fluoride  (SF 5000 PLUS ) 1.1 % CREA dental cream Take by mouth daily.   [DISCONTINUED] hydrochlorothiazide  (HYDRODIURIL ) 25 MG tablet Take 1 tablet (25 mg total) by mouth daily. (Patient not taking: Reported on 05/31/2024)   [DISCONTINUED] meloxicam (MOBIC) 15 MG tablet Take 15 mg by mouth as needed for pain. (Patient not taking: Reported on 05/31/2024)   [DISCONTINUED] metoprolol  succinate (TOPROL -XL) 25 MG 24 hr tablet Take 1 tablet (25 mg total) by mouth daily. (Patient not taking: Reported on 05/31/2024)   [DISCONTINUED] metoprolol   succinate (TOPROL -XL) 25 MG 24 hr tablet Take 1 tablet (25 mg total) by mouth daily. (Patient not taking: Reported on 05/31/2024)   [DISCONTINUED] metoprolol  tartrate (LOPRESSOR ) 25 MG tablet Take 25 mg by mouth 2 (two) times daily. (Patient not taking: Reported on 05/31/2024)   No facility-administered encounter medications on file as of 05/31/2024.     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   Past Surgical History:  Procedure Laterality Date   KNEE ARTHROSCOPY Right 07/13/2019   Procedure: ARTHROSCOPY KNEE, Removeal of chondral lose body, medial menisectomy;  Surgeon: Arlyne Lame, MD;  Location: [facility] ORS;  Service: Orthopedics;  Laterality: Right;   ROTATOR CUFF REPAIR Right    TOOTH EXTRACTION      Family History  Problem Relation Age of Onset   Hypertension Mother    Stroke Mother    Cancer Sister 36       non hodgkins lymphoma   Breast cancer Neg Hx    Social History   Occupational History   Not on file  Tobacco Use   Smoking status: Never   Smokeless tobacco: Never  Vaping Use   Vaping status: Never Used  Substance and Sexual Activity   Alcohol use: No   Drug use: No   Sexual activity: Yes    Tobacco Counseling Counseling given: Not Answered   Immunizations and Health Maintenance Immunization History  Administered Date(s)  Administered   Influenza-Unspecified 09/09/2023   Health Maintenance Due  Topic Date Due   DTaP/Tdap/Td (1 - Tdap) Never done   Colonoscopy  Never done   Pneumococcal Vaccine: 50+ Years (1 of 1 - PCV) Never done   Zoster Vaccines- Shingrix (1 of 2) Never done   DEXA SCAN  Never done    Activities of Daily Living    05/31/2024    9:05 AM  In your present state of health, do you have any difficulty performing the following activities:  Hearing? 0  Vision? 0  Difficulty concentrating or making decisions? 0  Walking or climbing stairs? 1  Dressing or  bathing? 0  Doing errands, shopping? 0    Physical Exam  Cardiovascular:     Rate and Rhythm: Normal rate and regular rhythm.  Pulmonary:     Effort: Pulmonary effort is normal.     Breath sounds: Normal breath sounds.  Abdominal:     Palpations: Abdomen is soft.     Tenderness: There is no abdominal tenderness.   Musculoskeletal:     Cervical back: Neck supple.   Skin:    General: Skin is warm and dry.   Neurological:     Mental Status: She is alert and oriented to person, place, and time.   Psychiatric:        Mood and Affect: Mood normal.     Advanced Directives: Is aware. Works as a Engineer, civil (consulting) for [employer].       ECG: Completed in June 2023 with NSR with rate of 64. No PAC/PVC. Appears similar to ECG from May 2023 Assessment:    This is a routine wellness examination for this patient .   Vision/Hearing screen Hearing Screening   500Hz  1000Hz  2000Hz  4000Hz   Right ear 40 20 25 25   Left ear 40 40 20 20   Vision Screening   Right eye Left eye Both eyes  Without correction     With correction 20/25 20/15 20/15     Dietary issues and exercise activities discussed:      Goals   None    Depression Screen    05/31/2024    9:01 AM 12/01/2023    9:03 AM 04/22/2022    9:44 AM  PHQ 2/9 Scores  PHQ - 2 Score 0 0 0  PHQ- 9 Score   0     Fall Risk    05/31/2024    9:00 AM  Fall Risk   Falls in the past year? 0  Number falls in past yr: 0  Injury with Fall? 0  Risk for fall due to : No Fall Risks  Follow up Falls evaluation completed    Cognitive Function:        05/31/2024    9:06 AM  6CIT Screen  What Year? 0 points  What month? 0 points  What time? 0 points  Count back from 20 0 points  Months in reverse 0 points  Repeat phrase 0 points  Total Score 0 points    Patient Care Team: Gabriel John, NP as PCP - General (Internal Medicine)     Plan:     I have personally reviewed and noted the following in the patient's chart:   Medical  and social history Use of alcohol, tobacco or illicit drugs  Current medications and supplements Functional ability and status Nutritional status Physical activity Advanced directives List of other physicians Hospitalizations, surgeries, and ER visits in previous 12 months Vitals Screenings to include cognitive,  depression, and falls Referrals and appointments  In addition, I have reviewed and discussed with patient certain preventive protocols, quality metrics, and best practice recommendations. A written personalized care plan for preventive services as well as general preventive health recommendations were provided to patient.     Kaien Pezzullo K Nikea Settle, NP 05/31/2024

## 2024-06-01 ENCOUNTER — Encounter: Payer: Self-pay | Admitting: *Deleted

## 2024-06-15 ENCOUNTER — Ambulatory Visit
Admission: RE | Admit: 2024-06-15 | Discharge: 2024-06-15 | Disposition: A | Discharge status: Home / Self Care | Payer: Self-pay | Source: Ambulatory Visit | Attending: Primary Care | Admitting: Primary Care

## 2024-06-15 DIAGNOSIS — I10 Essential (primary) hypertension: Secondary | ICD-10-CM | POA: Insufficient documentation

## 2024-06-15 DIAGNOSIS — Z8249 Family history of ischemic heart disease and other diseases of the circulatory system: Secondary | ICD-10-CM | POA: Insufficient documentation

## 2024-06-16 ENCOUNTER — Ambulatory Visit: Payer: Self-pay | Admitting: Primary Care

## 2024-07-01 DIAGNOSIS — M67911 Unspecified disorder of synovium and tendon, right shoulder: Secondary | ICD-10-CM | POA: Diagnosis not present

## 2024-07-01 DIAGNOSIS — M25511 Pain in right shoulder: Secondary | ICD-10-CM | POA: Diagnosis not present

## 2024-07-01 DIAGNOSIS — G8929 Other chronic pain: Secondary | ICD-10-CM | POA: Diagnosis not present

## 2024-07-08 DIAGNOSIS — M25511 Pain in right shoulder: Secondary | ICD-10-CM | POA: Diagnosis not present

## 2024-07-12 ENCOUNTER — Other Ambulatory Visit: Payer: Self-pay | Admitting: Student

## 2024-07-12 DIAGNOSIS — M67911 Unspecified disorder of synovium and tendon, right shoulder: Secondary | ICD-10-CM

## 2024-07-12 DIAGNOSIS — G8929 Other chronic pain: Secondary | ICD-10-CM

## 2024-07-18 ENCOUNTER — Ambulatory Visit
Admission: RE | Admit: 2024-07-18 | Discharge: 2024-07-18 | Disposition: A | Discharge status: Home / Self Care | Source: Ambulatory Visit | Attending: Student | Admitting: Student

## 2024-07-18 DIAGNOSIS — M25511 Pain in right shoulder: Secondary | ICD-10-CM | POA: Insufficient documentation

## 2024-07-18 DIAGNOSIS — M67911 Unspecified disorder of synovium and tendon, right shoulder: Secondary | ICD-10-CM | POA: Insufficient documentation

## 2024-07-18 DIAGNOSIS — G8929 Other chronic pain: Secondary | ICD-10-CM | POA: Diagnosis not present

## 2024-07-18 DIAGNOSIS — S46811A Strain of other muscles, fascia and tendons at shoulder and upper arm level, right arm, initial encounter: Secondary | ICD-10-CM | POA: Diagnosis not present

## 2024-07-18 DIAGNOSIS — M19011 Primary osteoarthritis, right shoulder: Secondary | ICD-10-CM | POA: Diagnosis not present

## 2024-07-18 DIAGNOSIS — M75121 Complete rotator cuff tear or rupture of right shoulder, not specified as traumatic: Secondary | ICD-10-CM | POA: Diagnosis not present

## 2024-07-25 DIAGNOSIS — M25511 Pain in right shoulder: Secondary | ICD-10-CM | POA: Diagnosis not present

## 2024-08-08 ENCOUNTER — Other Ambulatory Visit

## 2024-09-12 ENCOUNTER — Ambulatory Visit
Admission: RE | Admit: 2024-09-12 | Discharge: 2024-09-12 | Disposition: A | Discharge status: Home / Self Care | Payer: Self-pay | Source: Ambulatory Visit | Attending: Primary Care | Admitting: Primary Care

## 2024-09-12 DIAGNOSIS — E2839 Other primary ovarian failure: Secondary | ICD-10-CM | POA: Insufficient documentation

## 2024-09-12 DIAGNOSIS — M8580 Other specified disorders of bone density and structure, unspecified site: Secondary | ICD-10-CM | POA: Diagnosis not present

## 2024-09-12 DIAGNOSIS — Z780 Asymptomatic menopausal state: Secondary | ICD-10-CM | POA: Diagnosis not present

## 2024-09-26 DIAGNOSIS — M67911 Unspecified disorder of synovium and tendon, right shoulder: Secondary | ICD-10-CM | POA: Diagnosis not present

## 2024-09-26 DIAGNOSIS — G8929 Other chronic pain: Secondary | ICD-10-CM | POA: Diagnosis not present

## 2024-09-26 DIAGNOSIS — M25511 Pain in right shoulder: Secondary | ICD-10-CM | POA: Diagnosis not present

## 2024-09-26 DIAGNOSIS — M75101 Unspecified rotator cuff tear or rupture of right shoulder, not specified as traumatic: Secondary | ICD-10-CM | POA: Diagnosis not present

## 2024-10-21 NOTE — Therapy (Signed)
 OUTPATIENT PHYSICAL THERAPY  EVALUATION   Patient Name: Amy Sims MRN: [id number] DOB:18-Jan-1956, 68 y.o., female Today's Date: 10/24/2024  END OF SESSION:  PT End of Session - 10/24/24 1016     Visit Number 1    Number of Visits 16    Date for Recertification  12/19/24    Authorization Type [employer] Medicare %45 copay    Progress Note Due on Visit 10    PT Start Time 1016    PT Stop Time 1057    PT Time Calculation (min) 41 min    Activity Tolerance Patient tolerated treatment well    Behavior During Therapy WFL for tasks assessed/performed          Past Medical History:  Diagnosis Date   Generalized headaches    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   Past Surgical History:  Procedure Laterality Date   KNEE ARTHROSCOPY Right 07/13/2019   Procedure: ARTHROSCOPY KNEE, Removeal of chondral lose body, medial menisectomy;  Surgeon: Mardee Lynwood SQUIBB, MD;  Location: [facility] ORS;  Service: Orthopedics;  Laterality: Right;   ROTATOR CUFF REPAIR Right    TOOTH EXTRACTION     Patient Active Problem List   Diagnosis Date Noted   Welcome to Medicare preventive visit 05/31/2024   Family history of early CAD 11/21/2022   HTN (hypertension), benign 11/21/2022   Acute intractable headache 11/21/2022   Osteoarthritis 04/22/2022   Exertional dyspnea 04/22/2022   Vaginal atrophy 07/09/2018   Routine general medical examination at a health care facility 02/22/2014   Encounter for annual general medical examination with abnormal findings in adult 02/22/2014    PCP: Gretta Comer BEAL NP  REFERRING PROVIDER: Drake Chew, PA-C  REFERRING DIAG: [phone] (ICD-10-CM) - Unspecified disorder of synovium and tendon, right shoulder M25.511 (ICD-10-CM) - Pain in right shoulder G89.29 (ICD-10-CM) - Other chronic pain M75.101 (ICD-10-CM) - Unspecified rotator cuff tear or rupture of right shoulder, not specified as traumatic  THERAPY DIAG:  Chronic right shoulder pain  Muscle  weakness (generalized)  Abnormal posture  Rationale for Evaluation and Treatment: Rehabilitation  ONSET DATE: June 2025 worsening.   SUBJECTIVE:                                                                                                                                                                                      SUBJECTIVE STATEMENT: Pt indicated a fall in 2016 with symptoms.  Reported a surgery on shoulder then with some improvement but still having trouble.  Worsening in June of this year leading to go back to MD about it.  Pt indicated having trouble with getting arm up  and lifting items.  Reported having difficulty with those with some pain.  Pt indicated pain moderate in last month.  Some trouble with sleeping on Rt side.  Intermittent trouble noted.   Rt hand dominant   Reported therapy in August for shoulder prior to MRI.   PERTINENT HISTORY: Previous surgery on Rt shoulder   PAIN:  NPRS scale: at worst in last month or so 5 /10 Pain location: Rt shoulder anterior shoulder joint, Rt upper arm.   Pain description: dull, achy Aggravating factors: lifting, reaching, lying Rt side.  Relieving factors: OTC medicine as needed.   PRECAUTIONS: None  WEIGHT BEARING RESTRICTIONS: No  FALLS:  Has patient fallen in last 6 months? No  LIVING ENVIRONMENT: Lives in: House/apartment  OCCUPATION: Retired recently.   PLOF: Independent, driving, walking 1 mile per day, stretching.  House work. Light yard work.   PATIENT GOALS:Reduce pain, use arm better.    OBJECTIVE:   DIAGNOSTIC FINDINGS:  MRI: Impression: Retracted, full-thickness tear of the right supraspinatus tendon   PATIENT SURVEYS:  Patient-Specific Activity Scoring Scheme  0 represents "unable to perform." 10 represents "able to perform at prior level. 0 1 2 3 4 5 6 7 8 9  10 (Date and Score)   Activity Eval  10/24/2024    1. Sleeping on Rt side   5    2. Lifting milk jug  3    3. Getting  dishes in/out of cabinet 2   4.    5.    Score 3.33 avg    Total score = sum of the activity scores/number of activities Minimum detectable change (90%CI) for average score = 2 points Minimum detectable change (90%CI) for single activity score = 3 points  COGNITION: 10/24/2024 Overall cognitive status: WFL     SENSATION: 10/24/2024 WFL  POSTURE: 10/24/2024 Rounded shoulders noted.   UPPER EXTREMITY ROM:   ROM Right Eval 10/24/2024 AROM in supine Left Eval 10/24/2024  Shoulder flexion 155   Able to perform full range against gravity without resistance.    Shoulder extension    Shoulder abduction    Shoulder adduction    Shoulder internal rotation 70 AROM in 90 deg abduction   Shoulder external rotation 75 AROM in 90 deg abduction   Elbow flexion    Elbow extension    Wrist flexion    Wrist extension    Wrist ulnar deviation    Wrist radial deviation    Wrist pronation    Wrist supination    (Blank rows = not tested)  UPPER EXTREMITY MMT:  MMT Right Eval 10/24/2024 Left Eval 10/24/2024  Shoulder flexion 3+/5 5/5  Shoulder extension    Shoulder abduction 3+/5 5/5  Shoulder adduction    Shoulder internal rotation 5/5 5/5  Shoulder external rotation 3/5 5/5  Middle trapezius    Lower trapezius    Elbow flexion 5/5 5/5  Elbow extension 5/5 5/5  Wrist flexion    Wrist extension    Wrist ulnar deviation    Wrist radial deviation    Wrist pronation    Wrist supination    Grip strength (lbs)    (Blank rows = not tested)   SHOULDER SPECIAL TESTS: 10/24/2024 (+) rt painful arc in lowering against gravity in elevation.  (-) drop arm Rt  JOINT MOBILITY TESTING:  10/24/2024 Mild posterior glide Rt GH joint restriction.    PALPATION:  10/24/2024 Tenderness, trigger points in Rt infraspinatus, upper trap.  TODAY'S TREATMENT:                                                                                                       DATE: 10/24/2024 Therex:    HEP instruction/performance c cues for techniques, handout provided.  Trial set performed of each for comprehension and symptom assessment.  See below for exercise list  Manual: Rt posterior glide with mobilization c movement for ER/IR.    PATIENT EDUCATION: 10/24/2024 Education details: HEP, POC Person educated: Patient Education method: Programmer, Multimedia, Demonstration, Verbal cues, and Handouts Education comprehension: verbalized understanding, returned demonstration, and verbal cues required  HOME EXERCISE PROGRAM: Access Code: [facility] URL: [email or web address] Date: 10/24/2024 Prepared by: Ozell Silvan  Exercises - Supine Shoulder External Rotation Stretch (Mirrored)  - 1-2 x daily - 7 x weekly - 1 sets - 5-10 reps - 5 hold - Supine Shoulder Internal Rotation  - 1-2 x daily - 7 x weekly - 1 sets - 5-10 reps - 5 hold - Supine Shoulder Flexion Extension Full Range AROM  - 1-2 x daily - 7 x weekly - 1-2 sets - 10-30 reps - Sidelying Shoulder Abduction Palm Forward  - 1-2 x daily - 7 x weekly - 1-2 sets - 10-15 reps - Sidelying Shoulder External Rotation  - 1-2 x daily - 7 x weekly - 2-3 sets - 10-15 reps - Seated Scapular Retraction  - 3-5 x daily - 7 x weekly - 1 sets - 5-10 reps - 3-5 hold  ASSESSMENT:  CLINICAL IMPRESSION: Patient is a 68 y.o. who comes to clinic with complaints of Rt pain with mobility, strength and movement coordination deficits that impair their ability to perform usual daily and recreational functional activities without increase difficulty/symptoms at this time.  Patient to benefit from skilled PT services to address impairments and limitations to improve to previous level of function without restriction secondary to condition.   OBJECTIVE  IMPAIRMENTS: decreased activity tolerance, decreased coordination, decreased endurance, decreased mobility, decreased ROM, decreased strength, hypomobility, increased fascial restrictions, impaired perceived functional ability, increased muscle spasms, impaired flexibility, improper body mechanics, postural dysfunction, and pain.   ACTIVITY LIMITATIONS: carrying, lifting, sleeping, and reach over head  PARTICIPATION LIMITATIONS: meal prep, cleaning, laundry, interpersonal relationship, driving, shopping, and community activity  PERSONAL FACTORS: Time since onset of injury/illness/exacerbation and history of surgery on Rt shoulder.  are also affecting patient's functional outcome.   REHAB POTENTIAL: Good  CLINICAL DECISION MAKING: Stable/uncomplicated  EVALUATION COMPLEXITY: Low   GOALS: Goals reviewed with patient? Yes  SHORT TERM GOALS: (target date for Short term goals are 3 weeks 11/14/2024)  1.Patient will demonstrate independent use of home exercise program to maintain progress from in clinic treatments. Goal status: New  LONG TERM GOALS: (target dates for all long term goals are 8 weeks  12/19/2024 )   1. Patient will demonstrate/report pain at worst less than or equal to 2/10 to facilitate minimal limitation in daily activity secondary to pain symptoms. Goal status: New   2. Patient will demonstrate independent use of home exercise program to facilitate ability to  maintain/progress functional gains from skilled physical therapy services. Goal status: New   3. Patient will demonstrate Patient specific functional scale avg > or = 8/10 to indicate reduced disability due to condition.  Goal status: New   4.  Patient will demonstrate Rt UE MMT 5/5 throughout to facilitate lifting, reaching, carrying at [facility] in daily activity.   Goal status: New   5.  Patient will demonstrate Rt GH joint AROM WFL s symptoms to facilitate usual overhead reaching, self care, dressing at PLOF.     Goal status: New    PLAN:  PT FREQUENCY: 1-2x/week  PT DURATION: 8 weeks  PLANNED INTERVENTIONS: Can include 02853- PT Re-evaluation, 97110-Therapeutic exercises, 97530- Therapeutic activity, [id number]- Neuromuscular re-education, 97535- Self Care, 97140- Manual therapy, [phone]- Gait training, [phone]- Orthotic Fit/training, [phone]- Canalith repositioning, [id number]- Aquatic Therapy, [phone]- Electrical stimulation (unattended), [id number] Physical performance testing, 97016- Vasopneumatic device, [id number]- Ultrasound, [id number]- Traction (mechanical), [id number]- Ionotophoresis 4mg /ml Dexamethasone ,  79439 - Needle insertion w/o injection 1 or 2 muscles, 20561 - Needle insertion w/o injection 3 or more muscles.   Patient/Family education, Balance training, Stair training, Taping, Dry Needling, Joint mobilization, Joint manipulation, Spinal manipulation, Spinal mobilization, Scar mobilization, Vestibular training, Visual/preceptual remediation/compensation, DME instructions, Cryotherapy, and Moist heat.  All performed as medically necessary.  All included unless contraindicated  PLAN FOR NEXT SESSION: Review HEP knowledge/results.   Ozell Silvan, PT, DPT, OCS, ATC 10/24/24  11:54 AM   Date of referral: 09/26/2024 Referring provider: Drake Chew, PA-C Referring diagnosis? M67.911 (ICD-10-CM) - Unspecified disorder of synovium and tendon, right shoulder M25.511 (ICD-10-CM) - Pain in right shoulder G89.29 (ICD-10-CM) - Other chronic pain M75.101 (ICD-10-CM) - Unspecified rotator cuff tear or rupture of right shoulder, not specified as traumatic Treatment diagnosis? (if different than referring diagnosis) M25.511, M62.81, R29.3  What was this (referring dx) caused by? Ongoing Issue  Lysle of Condition: Chronic (continuous duration > 3 months)   Laterality: Rt  Current Functional Measure Score: Patient Specific Functional Scale eval avg:    Objective measurements identify impairments when they are compared  to normal values, the uninvolved extremity, and prior level of function.  [x]  Yes  []  No  Objective assessment of functional ability: Moderate functional limitations   Briefly describe symptoms: Pt indicated a fall in 2016 with symptoms.  Reported a surgery on shoulder then with some improvement but still having trouble.  Worsening in June of this year leading to go back to MD about it.  Pt indicated having trouble with getting arm up and lifting items.  Reported having difficulty with those with some pain.  Pt indicated pain moderate in last month.  Some trouble with sleeping on Rt side.  Intermittent trouble noted.   How did symptoms start: Initially 2016, worsened in June 205   Average pain intensity:  Last 24 hours: 5/10  Past week: 5/10  How often does the pt experience symptoms? Intermittently  How much have the symptoms interfered with usual daily activities? Moderately  How has condition changed since care began at this facility? NA - initial visit  In general, how is the patients overall health? Good   BACK PAIN (STarT Back Screening Tool) No

## 2024-10-24 ENCOUNTER — Ambulatory Visit: Admitting: Rehabilitative and Restorative Service Providers"

## 2024-10-24 ENCOUNTER — Encounter: Payer: Self-pay | Admitting: Rehabilitative and Restorative Service Providers"

## 2024-10-24 DIAGNOSIS — R293 Abnormal posture: Secondary | ICD-10-CM | POA: Diagnosis not present

## 2024-10-24 DIAGNOSIS — M6281 Muscle weakness (generalized): Secondary | ICD-10-CM | POA: Diagnosis not present

## 2024-10-24 DIAGNOSIS — M25511 Pain in right shoulder: Secondary | ICD-10-CM | POA: Diagnosis not present

## 2024-10-24 DIAGNOSIS — G8929 Other chronic pain: Secondary | ICD-10-CM

## 2024-10-31 ENCOUNTER — Encounter: Payer: Self-pay | Admitting: Physical Therapy

## 2024-10-31 ENCOUNTER — Ambulatory Visit: Admitting: Physical Therapy

## 2024-10-31 DIAGNOSIS — G8929 Other chronic pain: Secondary | ICD-10-CM

## 2024-10-31 DIAGNOSIS — M6281 Muscle weakness (generalized): Secondary | ICD-10-CM

## 2024-10-31 DIAGNOSIS — R293 Abnormal posture: Secondary | ICD-10-CM | POA: Diagnosis not present

## 2024-10-31 DIAGNOSIS — M25511 Pain in right shoulder: Secondary | ICD-10-CM

## 2024-10-31 NOTE — Therapy (Signed)
 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   Past Surgical History:  Procedure Laterality Date   KNEE ARTHROSCOPY Right 07/13/2019   Procedure: ARTHROSCOPY KNEE, Removeal of chondral lose body, medial menisectomy;  Surgeon: Mardee Lynwood SQUIBB, MD;  Location: [facility] ORS;  Service: Orthopedics;  Laterality: Right;   ROTATOR CUFF REPAIR Right    TOOTH EXTRACTION     Patient Active Problem List   Diagnosis Date Noted   Welcome to Medicare preventive visit 05/31/2024   Family history of early CAD 11/21/2022   HTN (hypertension), benign 11/21/2022   Acute intractable headache 11/21/2022   Osteoarthritis 04/22/2022   Exertional dyspnea 04/22/2022   Vaginal atrophy 07/09/2018   Routine general medical examination at a health care facility 02/22/2014   Encounter for annual general medical examination with abnormal findings in adult 02/22/2014    PCP: Gretta Comer BEAL NP  REFERRING PROVIDER: Drake Chew, PA-C  REFERRING DIAG: [phone] (ICD-10-CM) - Unspecified disorder of synovium and tendon, right shoulder M25.511 (ICD-10-CM) - Pain in right shoulder G89.29 (ICD-10-CM) - Other chronic pain M75.101 (ICD-10-CM) - Unspecified rotator cuff tear or rupture of right shoulder, not specified as traumatic  THERAPY DIAG:  Chronic right shoulder pain  Muscle weakness (generalized)  Abnormal posture  Rationale for  Evaluation and Treatment: Rehabilitation  ONSET DATE: June 2025 worsening.   SUBJECTIVE:                                                                                                                                                                                      SUBJECTIVE STATEMENT: She did her exercises and they seemed to help.    Rt hand dominant   Reported therapy in August for shoulder prior to MRI.   PERTINENT HISTORY: Previous surgery on Rt shoulder   PAIN:  NPRS scale: since last PT 3 /10 Pain location: Rt shoulder anterior shoulder joint, Rt upper arm.   Pain description: dull,  achy Aggravating factors: lifting, reaching, lying Rt side.  Relieving factors: OTC medicine as needed.   PRECAUTIONS: None  WEIGHT BEARING RESTRICTIONS: No  FALLS:  Has patient fallen in last 6 months? No  LIVING ENVIRONMENT: Lives in: House/apartment  OCCUPATION: Retired recently.   PLOF: Independent, driving, walking 1 mile per day, stretching.  House work. Light yard work.   PATIENT GOALS:Reduce pain, use arm better.    OBJECTIVE:   DIAGNOSTIC FINDINGS:  MRI: Impression: Retracted, full-thickness tear of the right supraspinatus tendon   PATIENT SURVEYS:  Patient-Specific Activity Scoring Scheme  0 represents "unable to perform." 10 represents "able to perform at prior level. 0 1 2 3 4 5 6 7 8 9  10 (Date and Score)   Activity Eval  10/24/2024    1. Sleeping on Rt side   5    2. Lifting milk jug  3    3. Getting dishes in/out of cabinet 2   4.    5.    Score 3.33 avg    Total score = sum of the activity scores/number of activities Minimum detectable change (90%CI) for average score = 2 points Minimum detectable change (90%CI) for single activity score = 3 points  COGNITION: 10/24/2024 Overall cognitive status: WFL     SENSATION: 10/24/2024 WFL  POSTURE: 10/24/2024 Rounded shoulders noted.   UPPER EXTREMITY ROM:   ROM  Right Eval 10/24/2024 AROM in supine Left 10/31/24  Shoulder flexion 155   Able to perform full range against gravity without resistance.    Shoulder extension    Shoulder abduction    Shoulder adduction    Shoulder internal rotation 70 AROM in 90 deg abduction   Shoulder external rotation 75 AROM in 90 deg abduction   Elbow flexion    Elbow extension    Wrist flexion    Wrist extension    Wrist ulnar deviation    Wrist radial deviation    Wrist pronation    Wrist supination    (Blank rows = not tested)  UPPER EXTREMITY MMT:  MMT Right Eval 10/24/2024 Left Eval 10/24/2024  Shoulder flexion 3+/5 5/5  Shoulder extension    Shoulder abduction 3+/5 5/5  Shoulder adduction    Shoulder internal rotation 5/5 5/5  Shoulder external rotation 3/5 5/5  Middle trapezius    Lower trapezius    Elbow flexion 5/5 5/5  Elbow extension 5/5 5/5  Wrist flexion    Wrist extension    Wrist ulnar deviation    Wrist radial deviation    Wrist pronation    Wrist supination    Grip strength (lbs)    (Blank rows = not tested)   SHOULDER SPECIAL TESTS: 10/24/2024 (+) rt painful arc in lowering against gravity in elevation.  (-) drop arm Rt  JOINT MOBILITY TESTING:  10/24/2024 Mild posterior glide Rt GH joint restriction.    PALPATION:  10/24/2024 Tenderness, trigger points in Rt infraspinatus, upper trap.  TODAY'S TREATMENT:                                                                                                       DATE: 10/31/2024 Therapeutic Exercise:  supine external rotation stretch with 2# weight & 3 deep breaths for hold 2 reps with shoulder ~45* abduction, then 2 reps ~70* abduction, then 2 reps 90* abduction. Supine shoulder flexion with pool noodle bw wrist with slight  adduction squeeze to facilitate shoulder motion without substitutions 10 reps no wt, then 1# in Rt hand 5 reps.  PT cues on scapula motion.  Supine (scapula retraction prior to motion) biceps curl 2# with supination/palm up 10 reps, neutral pronation/supination 10 reps and pronation/palm down 10 reps Sidelying RUE ER 1# only able to AROM to neutral (hand in front of elbow) 10 reps (no difference in AROM with or without wt) Sidelying RUE ER AAROM up to 45* past neutral. 10 reps Side lying RUE AROM abduction 10 reps  Neuromuscular Re-education: PT demo & verbal cues on upper body posture and shoulder function.  Seated scapular retraction 5 sec hold 10 reps & cervical retraction supine 5 sec hold 10 reps.     TREATMENT:                                                                                                       DATE: 10/24/2024 Therex:    HEP instruction/performance c cues for techniques, handout provided.  Trial set performed of each for comprehension and symptom assessment.  See below for exercise list  Manual: Rt posterior glide with mobilization c movement for ER/IR.    PATIENT EDUCATION: 10/24/2024 Education details: HEP, POC Person educated: Patient Education method: Programmer, Multimedia, Demonstration, Verbal cues, and Handouts Education comprehension: verbalized understanding, returned demonstration, and verbal cues required  HOME EXERCISE PROGRAM: Access Code: [facility] URL: [email or web address] Date: 10/24/2024 Prepared by: Ozell Silvan  Exercises - Supine Shoulder External Rotation Stretch (Mirrored)  - 1-2 x daily - 7 x weekly - 1 sets - 5-10 reps - 5 hold - Supine Shoulder Internal Rotation  - 1-2 x daily - 7 x weekly - 1 sets - 5-10 reps - 5 hold - Supine Shoulder Flexion Extension Full Range AROM  - 1-2 x daily - 7 x weekly - 1-2 sets - 10-30 reps - Sidelying Shoulder Abduction Palm Forward  - 1-2 x daily - 7 x weekly - 1-2 sets - 10-15 reps - Sidelying  Shoulder External Rotation  - 1-2 x daily - 7 x weekly - 2-3 sets - 10-15 reps - Seated Scapular Retraction  - 3-5 x daily - 7 x weekly - 1 sets -  5-10 reps - 3-5 hold  ASSESSMENT:  CLINICAL IMPRESSION: Patient had less pain with scapular stabilization prior to motions.  Patient has biceps pain with shoulder motions.    OBJECTIVE IMPAIRMENTS: decreased activity tolerance, decreased coordination, decreased endurance, decreased mobility, decreased ROM, decreased strength, hypomobility, increased fascial restrictions, impaired perceived functional ability, increased muscle spasms, impaired flexibility, improper body mechanics, postural dysfunction, and pain.   ACTIVITY LIMITATIONS: carrying, lifting, sleeping, and reach over head  PARTICIPATION LIMITATIONS: meal prep, cleaning, laundry, interpersonal relationship, driving, shopping, and community activity  PERSONAL FACTORS: Time since onset of injury/illness/exacerbation and history of surgery on Rt shoulder.  are also affecting patient's functional outcome.   REHAB POTENTIAL: Good  CLINICAL DECISION MAKING: Stable/uncomplicated  EVALUATION COMPLEXITY: Low   GOALS: Goals reviewed with patient? Yes  SHORT TERM GOALS: (target date for Short term goals are 3 weeks 11/14/2024)  1.Patient will demonstrate independent use of home exercise program to maintain progress from in clinic treatments. Goal status: Ongoing   10/31/2024  LONG TERM GOALS: (target dates for all long term goals are 8 weeks  12/19/2024 )   1. Patient will demonstrate/report pain at worst less than or equal to 2/10 to facilitate minimal limitation in daily activity secondary to pain symptoms. Goal status: Ongoing   10/31/2024   2. Patient will demonstrate independent use of home exercise program to facilitate ability to maintain/progress functional gains from skilled physical therapy services. Goal status: Ongoing   10/31/2024   3. Patient will demonstrate Patient  specific functional scale avg > or = 8/10 to indicate reduced disability due to condition.  Goal status: Ongoing   10/31/2024   4.  Patient will demonstrate Rt UE MMT 5/5 throughout to facilitate lifting, reaching, carrying at [facility] in daily activity.  Goal status: Ongoing   10/31/2024   5.  Patient will demonstrate Rt GH joint AROM WFL s symptoms to facilitate usual overhead reaching, self care, dressing at PLOF.  Goal status: Ongoing   10/31/2024    PLAN:  PT FREQUENCY: 1-2x/week  PT DURATION: 8 weeks  PLANNED INTERVENTIONS: Can include 02853- PT Re-evaluation, 97110-Therapeutic exercises, 97530- Therapeutic activity, 97112- Neuromuscular re-education, 97535- Self Care, 97140- Manual therapy, [phone]- Gait training, [phone]- Orthotic Fit/training, [phone]- Canalith repositioning, [id number]- Aquatic Therapy, [phone]- Electrical stimulation (unattended), [id number] Physical performance testing, 97016- Vasopneumatic device, [id number]- Ultrasound, [id number]- Traction (mechanical), [id number]- Ionotophoresis 4mg /ml Dexamethasone ,  79439 - Needle insertion w/o injection 1 or 2 muscles, 20561 - Needle insertion w/o injection 3 or more muscles.   Patient/Family education, Balance training, Stair training, Taping, Dry Needling, Joint mobilization, Joint manipulation, Spinal manipulation, Spinal mobilization, Scar mobilization, Vestibular training, Visual/preceptual remediation/compensation, DME instructions, Cryotherapy, and Moist heat.  All performed as medically necessary.  All included unless contraindicated  PLAN FOR NEXT SESSION:  manual therapy, exercises and neuromuscular re-education for shoulder range with less pain.    Grayce Spatz, PT, DPT 10/31/2024, 11:15 AM   Date of referral: 09/26/2024 Referring provider: Drake Chew, PA-C Referring diagnosis? M67.911 (ICD-10-CM) - Unspecified disorder of synovium and tendon, right shoulder M25.511 (ICD-10-CM) - Pain in right shoulder G89.29 (ICD-10-CM) - Other  chronic pain M75.101 (ICD-10-CM) - Unspecified rotator cuff tear or rupture of right shoulder, not specified as traumatic Treatment diagnosis? (if different than referring diagnosis) M25.511, M62.81, R29.3  What was this (referring dx) caused by? Ongoing Issue  Lysle of Condition: Chronic (continuous duration > 3 months)   Laterality: Rt  Current Functional Measure Score: Patient Specific Functional Scale eval avg:  Objective measurements identify impairments when they are compared to normal values, the uninvolved extremity, and prior level of function.  [x]  Yes  []  No  Objective assessment of functional ability: Moderate functional limitations   Briefly describe symptoms: Pt indicated a fall in 2016 with symptoms.  Reported a surgery on shoulder then with some improvement but still having trouble.  Worsening in June of this year leading to go back to MD about it.  Pt indicated having trouble with getting arm up and lifting items.  Reported having difficulty with those with some pain.  Pt indicated pain moderate in last month.  Some trouble with sleeping on Rt side.  Intermittent trouble noted.   How did symptoms start: Initially 2016, worsened in June 205   Average pain intensity:  Last 24 hours: 5/10  Past week: 5/10  How often does the pt experience symptoms? Intermittently  How much have the symptoms interfered with usual daily activities? Moderately  How has condition changed since care began at this facility? NA - initial visit  In general, how is the patients overall health? Good   BACK PAIN (STarT Back Screening Tool) No

## 2024-11-08 ENCOUNTER — Ambulatory Visit: Admitting: Physical Therapy

## 2024-11-08 ENCOUNTER — Encounter: Payer: Self-pay | Admitting: Physical Therapy

## 2024-11-08 DIAGNOSIS — R293 Abnormal posture: Secondary | ICD-10-CM

## 2024-11-08 DIAGNOSIS — M6281 Muscle weakness (generalized): Secondary | ICD-10-CM | POA: Diagnosis not present

## 2024-11-08 DIAGNOSIS — G8929 Other chronic pain: Secondary | ICD-10-CM | POA: Diagnosis not present

## 2024-11-08 DIAGNOSIS — M25511 Pain in right shoulder: Secondary | ICD-10-CM

## 2024-11-08 NOTE — Therapy (Signed)
 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   Past Surgical History:  Procedure Laterality Date   KNEE ARTHROSCOPY Right 07/13/2019   Procedure: ARTHROSCOPY KNEE, Removeal of chondral lose body, medial menisectomy;  Surgeon: Mardee Lynwood SQUIBB, MD;  Location: [facility] ORS;  Service: Orthopedics;  Laterality: Right;   ROTATOR CUFF REPAIR Right    TOOTH EXTRACTION     Patient Active Problem List   Diagnosis Date Noted   Welcome to Medicare preventive visit 05/31/2024   Family history of early CAD 11/21/2022   HTN (hypertension), benign 11/21/2022   Acute intractable headache 11/21/2022   Osteoarthritis 04/22/2022   Exertional dyspnea 04/22/2022   Vaginal atrophy 07/09/2018   Routine general medical examination at a health care facility 02/22/2014   Encounter for annual general medical examination with abnormal findings in adult 02/22/2014    PCP: Gretta Comer BEAL NP  REFERRING PROVIDER: Drake Chew, PA-C  REFERRING DIAG: [id number] (ICD-10-CM) - Unspecified disorder of synovium and tendon, right shoulder M25.511 (ICD-10-CM) - Pain in right shoulder G89.29 (ICD-10-CM) - Other chronic pain M75.101 (ICD-10-CM) - Unspecified rotator cuff tear or rupture of right shoulder, not specified as traumatic  THERAPY DIAG:  Chronic right shoulder pain  Muscle weakness (generalized)  Abnormal posture  Rationale  for Evaluation and Treatment: Rehabilitation  ONSET DATE: June 2025 worsening.   SUBJECTIVE:                                                                                                                                                                                      SUBJECTIVE STATEMENT: Pt stating she needs her exercises printed again. Pt reporting improvements since starting therapy. Pt also still reporting 3/10 pain with end range flexion.   Rt hand dominant   Reported therapy in August for shoulder prior to MRI.   PERTINENT HISTORY: Previous surgery on Rt shoulder   PAIN:  NPRS scale: 3/10 Pain location: Rt shoulder anterior shoulder joint, Rt upper arm.   Pain description: dull, achy Aggravating factors: lifting, reaching, lying Rt side.  Relieving factors: OTC medicine as needed.   PRECAUTIONS: None  WEIGHT BEARING RESTRICTIONS: No  FALLS:  Has patient fallen in last 6 months? No  LIVING ENVIRONMENT: Lives in: House/apartment  OCCUPATION: Retired recently.   PLOF: Independent, driving, walking 1 mile per day, stretching.  House work. Light yard work.   PATIENT GOALS:Reduce pain, use arm better.    OBJECTIVE:   DIAGNOSTIC FINDINGS:  MRI: Impression: Retracted, full-thickness tear of the right supraspinatus tendon   PATIENT SURVEYS:  Patient-Specific Activity Scoring Scheme  0 represents "unable to perform." 10 represents "able to perform at prior level. 0 1 2 3 4 5 6 7 8 9  10 (Date and Score)   Activity Eval  10/24/2024    1. Sleeping on Rt side   5    2. Lifting milk jug  3    3. Getting dishes in/out of cabinet 2   4.    5.    Score 3.33 avg    Total score = sum of the activity scores/number of activities Minimum detectable change (90%CI) for average score = 2 points Minimum detectable change (90%CI) for single activity score = 3 points  COGNITION: 10/24/2024 Overall cognitive status:  WFL     SENSATION: 10/24/2024 WFL  POSTURE: 10/24/2024 Rounded shoulders noted.   UPPER EXTREMITY ROM:   ROM Right Eval 10/24/2024 AROM in supine Left 10/31/24 Rt 11/08/24 Active supine  Shoulder flexion 155   Able to perform full range against gravity without resistance.   162 deg Pain with descending    Shoulder extension     Shoulder abduction     Shoulder adduction     Shoulder internal rotation 70 AROM in 90 deg abduction  72 deg Shoulder abd 90 deg  Shoulder external rotation 75 AROM in 90 deg abduction  80 deg shoulder abd 90 deg  Elbow flexion     Elbow extension     Wrist flexion     Wrist extension     Wrist ulnar deviation     Wrist radial deviation     Wrist pronation     Wrist supination     (Blank rows = not tested)  UPPER EXTREMITY MMT:  MMT Right Eval 10/24/2024 Left Eval 10/24/2024  Shoulder flexion 3+/5 5/5  Shoulder extension    Shoulder abduction 3+/5 5/5  Shoulder adduction    Shoulder internal rotation 5/5 5/5  Shoulder external rotation 3/5 5/5  Middle trapezius    Lower trapezius    Elbow flexion 5/5 5/5  Elbow extension 5/5 5/5  Wrist flexion    Wrist extension    Wrist ulnar deviation    Wrist radial deviation    Wrist pronation    Wrist supination    Grip strength (lbs)    (Blank rows = not tested)   SHOULDER SPECIAL TESTS: 10/24/2024 (+) rt painful arc in lowering against gravity in elevation.  (-) drop arm Rt  JOINT MOBILITY TESTING:  10/24/2024 Mild posterior glide Rt GH joint restriction.    PALPATION:  10/24/2024 Tenderness, trigger points in Rt infraspinatus, upper trap.  TODAY'S TREATMENT:                                                                                                       DATE:  11/08/2024 Therapeutic Exercise: supine AA shoulder flexion c 1 # bar x 15 holding end range 2 sec Side lying shoulder abd: x 15  Side lying shoulder ER c 1 # 2 x 15 ROM updated see above  Manual: Rt posterior glide with mobilization c movement for ER/IR.  Neuromuscular Re-education: Scapular stability in supine: small circles with 1 # weight x 15 each direction Shoulder protraction with flexion x 10 Reactive ER of Rt shoulder using red TB walk outs x 10  TherActivires:  Rows: green TB 2 x 15 holding 3 sec UBE: Level 1.5, push/pull, strengthening, ROM, 3 minutes each direction with 1 minute rest break Shoulder flexion standing ball rolls x 15     TODAY'S TREATMENT:                                                                                                       DATE: 10/31/2024 Therapeutic Exercise:  supine external rotation stretch with 2# weight & 3 deep breaths for hold 2 reps with shoulder ~45* abduction, then 2 reps ~70* abduction, then 2 reps 90* abduction. Supine shoulder flexion with pool noodle bw wrist with slight adduction squeeze to facilitate shoulder motion without substitutions 10 reps no wt, then 1# in Rt hand 5 reps.  PT cues on scapula motion.  Supine (scapula retraction prior to motion) biceps curl 2# with supination/palm up 10 reps, neutral pronation/supination 10 reps and pronation/palm down 10 reps Sidelying RUE ER 1# only able to AROM to neutral (hand in front of elbow) 10 reps (no difference in AROM with or without wt) Sidelying RUE ER AAROM up to 45* past neutral. 10 reps Side lying RUE AROM abduction 10 reps  Neuromuscular Re-education: PT demo & verbal cues on upper body posture and shoulder function.  Seated scapular retraction 5 sec hold 10 reps & cervical retraction supine 5 sec hold 10 reps.     TREATMENT:  DATE: 10/24/2024 Therex:    HEP  instruction/performance c cues for techniques, handout provided.  Trial set performed of each for comprehension and symptom assessment.  See below for exercise list  Manual: Rt posterior glide with mobilization c movement for ER/IR.    PATIENT EDUCATION: 10/24/2024 Education details: HEP, POC Person educated: Patient Education method: Programmer, Multimedia, Demonstration, Verbal cues, and Handouts Education comprehension: verbalized understanding, returned demonstration, and verbal cues required  HOME EXERCISE PROGRAM: Access Code: [facility] URL: [email or web address] Date: 11/08/2024 Prepared by: Delon Lunger  Exercises - Supine Shoulder External Rotation Stretch (Mirrored)  - 1-2 x daily - 7 x weekly - 1 sets - 5-10 reps - 5 hold - Supine Shoulder Internal Rotation  - 1-2 x daily - 7 x weekly - 1 sets - 5-10 reps - 5 hold - Supine Shoulder Flexion Extension Full Range AROM  - 1-2 x daily - 7 x weekly - 1-2 sets - 10-30 reps - Sidelying Shoulder Abduction Palm Forward  - 1-2 x daily - 7 x weekly - 1-2 sets - 10-15 reps - Sidelying Shoulder External Rotation  - 1-2 x daily - 7 x weekly - 2-3 sets - 10-15 reps - Seated Scapular Retraction  - 3-5 x daily - 7 x weekly - 1 sets - 5-10 reps - 3-5 hold - Shoulder External Rotation and Scapular Retraction with Resistance  - 1-2 x daily - 7 x weekly - 2 sets - 10 reps - Standing Row with Anchored Resistance  - 1-2 x daily - 7 x weekly - 2 sets - 10 reps - 3 seconds hold - Sidelying Shoulder ER with Towel and Dumbbell  - 1-2 x daily - 7 x weekly - 2 sets - 10 reps - Supine Shoulder External Rotation with Dowel  - 1-2 x daily - 7 x weekly - 2 sets - 10 reps - External Rotation Reactive Isometrics with Flex Bar  - 1-3 x daily - 7 x weekly - 10 reps  ASSESSMENT:  CLINICAL IMPRESSION: Pt tolerating treatment well with pain noted at end range flexion and abduction. Pt's HEP was updated and new handout printed. Pt was issued thera-bands for  her home program. Pt with good response to manual therapy. Limited with ER strength.  Recommending continued skilled PT interventions.    OBJECTIVE IMPAIRMENTS: decreased activity tolerance, decreased coordination, decreased endurance, decreased mobility, decreased ROM, decreased strength, hypomobility, increased fascial restrictions, impaired perceived functional ability, increased muscle spasms, impaired flexibility, improper body mechanics, postural dysfunction, and pain.   ACTIVITY LIMITATIONS: carrying, lifting, sleeping, and reach over head  PARTICIPATION LIMITATIONS: meal prep, cleaning, laundry, interpersonal relationship, driving, shopping, and community activity  PERSONAL FACTORS: Time since onset of injury/illness/exacerbation and history of surgery on Rt shoulder.  are also affecting patient's functional outcome.   REHAB POTENTIAL: Good  CLINICAL DECISION MAKING: Stable/uncomplicated  EVALUATION COMPLEXITY: Low   GOALS: Goals reviewed with patient? Yes  SHORT TERM GOALS: (target date for Short term goals are 3 weeks 11/14/2024)  1.Patient will demonstrate independent use of home exercise program to maintain progress from in clinic treatments. Goal status:Met 11/08/24  LONG TERM GOALS: (target dates for all long term goals are 8 weeks  12/19/2024 )   1. Patient will demonstrate/report pain at worst less than or equal to 2/10 to facilitate minimal limitation in daily activity secondary to pain symptoms. Goal status: Ongoing   10/31/2024   2. Patient will demonstrate independent use of home exercise program to facilitate ability to maintain/progress functional gains  from skilled physical therapy services. Goal status: Ongoing   10/31/2024   3. Patient will demonstrate Patient specific functional scale avg > or = 8/10 to indicate reduced disability due to condition.  Goal status: Ongoing   10/31/2024   4.  Patient will demonstrate Rt UE MMT 5/5 throughout to facilitate  lifting, reaching, carrying at [facility] in daily activity.  Goal status: Ongoing   10/31/2024   5.  Patient will demonstrate Rt GH joint AROM WFL s symptoms to facilitate usual overhead reaching, self care, dressing at PLOF.  Goal status: Ongoing   10/31/2024    PLAN:  PT FREQUENCY: 1-2x/week  PT DURATION: 8 weeks  PLANNED INTERVENTIONS: Can include 02853- PT Re-evaluation, 97110-Therapeutic exercises, 97530- Therapeutic activity, 97112- Neuromuscular re-education, 97535- Self Care, 97140- Manual therapy, [phone]- Gait training, [phone]- Orthotic Fit/training, [id number]- Canalith repositioning, [id number]- Aquatic Therapy, [phone]- Electrical stimulation (unattended), [id number] Physical performance testing, 97016- Vasopneumatic device, [id number]- Ultrasound, [id number]- Traction (mechanical), [id number]- Ionotophoresis 4mg /ml Dexamethasone ,  79439 - Needle insertion w/o injection 1 or 2 muscles, 20561 - Needle insertion w/o injection 3 or more muscles.   Patient/Family education, Balance training, Stair training, Taping, Dry Needling, Joint mobilization, Joint manipulation, Spinal manipulation, Spinal mobilization, Scar mobilization, Vestibular training, Visual/preceptual remediation/compensation, DME instructions, Cryotherapy, and Moist heat.  All performed as medically necessary.  All included unless contraindicated  PLAN FOR NEXT SESSION:  see how updated HEP went, manual as needed   Delon JONELLE Lunger, PT, MPT 11/08/2024, 9:34 AM   Date of referral: 09/26/2024 Referring provider: Drake Chew, PA-C Referring diagnosis? M67.911 (ICD-10-CM) - Unspecified disorder of synovium and tendon, right shoulder M25.511 (ICD-10-CM) - Pain in right shoulder G89.29 (ICD-10-CM) - Other chronic pain M75.101 (ICD-10-CM) - Unspecified rotator cuff tear or rupture of right shoulder, not specified as traumatic Treatment diagnosis? (if different than referring diagnosis) M25.511, M62.81, R29.3  What was this (referring dx) caused by?  Ongoing Issue  Lysle of Condition: Chronic (continuous duration > 3 months)   Laterality: Rt  Current Functional Measure Score: Patient Specific Functional Scale eval avg:    Objective measurements identify impairments when they are compared to normal values, the uninvolved extremity, and prior level of function.  [x]  Yes  []  No  Objective assessment of functional ability: Moderate functional limitations   Briefly describe symptoms: Pt indicated a fall in 2016 with symptoms.  Reported a surgery on shoulder then with some improvement but still having trouble.  Worsening in June of this year leading to go back to MD about it.  Pt indicated having trouble with getting arm up and lifting items.  Reported having difficulty with those with some pain.  Pt indicated pain moderate in last month.  Some trouble with sleeping on Rt side.  Intermittent trouble noted.   How did symptoms start: Initially 2016, worsened in June 205   Average pain intensity:  Last 24 hours: 5/10  Past week: 5/10  How often does the pt experience symptoms? Intermittently  How much have the symptoms interfered with usual daily activities? Moderately  How has condition changed since care began at this facility? NA - initial visit  In general, how is the patients overall health? Good   BACK PAIN (STarT Back Screening Tool) No

## 2024-11-21 ENCOUNTER — Encounter: Payer: Self-pay | Admitting: Rehabilitative and Restorative Service Providers"

## 2024-11-21 ENCOUNTER — Ambulatory Visit: Admitting: Rehabilitative and Restorative Service Providers"

## 2024-11-21 DIAGNOSIS — G8929 Other chronic pain: Secondary | ICD-10-CM

## 2024-11-21 DIAGNOSIS — R293 Abnormal posture: Secondary | ICD-10-CM

## 2024-11-21 DIAGNOSIS — M6281 Muscle weakness (generalized): Secondary | ICD-10-CM

## 2024-11-21 NOTE — Therapy (Signed)
 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   Past Surgical History:  Procedure Laterality Date   KNEE ARTHROSCOPY Right 07/13/2019   Procedure: ARTHROSCOPY KNEE, Removeal of chondral lose body, medial menisectomy;  Surgeon: Mardee Lynwood SQUIBB, MD;  Location: [facility];  Service: Orthopedics;  Laterality: Right;   ROTATOR CUFF REPAIR Right    TOOTH EXTRACTION     Patient Active Problem List   Diagnosis Date Noted   Welcome to Medicare preventive visit 05/31/2024   Family history of early CAD 11/21/2022   HTN (hypertension), benign 11/21/2022   Acute intractable headache 11/21/2022   Osteoarthritis 04/22/2022   Exertional dyspnea 04/22/2022   Vaginal atrophy 07/09/2018   Routine general medical examination at a health care facility 02/22/2014   Encounter for annual general medical examination with abnormal findings in adult 02/22/2014    PCP: Gretta Comer BEAL NP  REFERRING PROVIDER: Drake Chew, PA-C  REFERRING DIAG: [phone] (ICD-10-CM) - Unspecified disorder of synovium and tendon, right shoulder M25.511 (ICD-10-CM) - Pain in right shoulder G89.29 (ICD-10-CM) - Other chronic pain M75.101 (ICD-10-CM) - Unspecified rotator cuff tear or rupture of right shoulder, not specified as traumatic  THERAPY DIAG:  Chronic right shoulder pain  Muscle weakness (generalized)  Abnormal posture  Rationale  for Evaluation and Treatment: Rehabilitation  ONSET DATE: June 2025 worsening.   SUBJECTIVE:                                                                                                                                                                                      SUBJECTIVE STATEMENT: Pt indicated having some complaints after exercise yesterday.  Reported having no pain upon arrival today.   Reported some better overall.    Rt hand dominant   Reported therapy in August for shoulder prior to MRI.   PERTINENT HISTORY: Previous surgery on Rt shoulder   PAIN:  NPRS scale: at worst 5/10 Pain location: Rt shoulder anterior shoulder joint, Rt upper arm.   Pain description: dull, achy Aggravating factors: lifting, reaching, lying Rt side.  Relieving factors: OTC medicine as needed.   PRECAUTIONS: None  WEIGHT BEARING RESTRICTIONS: No  FALLS:  Has patient fallen in last 6 months? No  LIVING ENVIRONMENT: Lives in: House/apartment  OCCUPATION: Retired recently.   PLOF: Independent, driving, walking 1 mile per day, stretching.  House work. Light yard work.   PATIENT GOALS:Reduce pain, use arm better.    OBJECTIVE:   DIAGNOSTIC FINDINGS:  MRI: Impression: Retracted, full-thickness tear of the right supraspinatus tendon   PATIENT SURVEYS:  Patient-Specific Activity Scoring Scheme  0 represents "unable to perform." 10 represents "able to perform at prior level. 0 1 2 3 4 5 6 7 8 9  10 (Date and Score)   Activity Eval  10/24/2024  11/21/2024  1. Sleeping on Rt side   5 5   2. Lifting milk jug  3  5  3. Getting dishes in/out of cabinet 2 5  4.    5.    Score 3.33 avg    Total score = sum of the activity scores/number of activities Minimum detectable change (90%CI) for average score = 2 points Minimum detectable change (90%CI) for single activity score = 3 points  COGNITION: 10/24/2024 Overall cognitive status:  WFL     SENSATION: 10/24/2024 WFL  POSTURE: 10/24/2024 Rounded shoulders noted.   UPPER EXTREMITY ROM:   ROM Right Eval 10/24/2024 AROM in supine Left 10/31/24 Rt 11/08/24 Active supine Rt 11/21/2024 AROM in supine  Shoulder flexion 155   Able to perform full range against gravity without resistance.   162 deg Pain with descending   168  Shoulder extension      Shoulder abduction      Shoulder adduction      Shoulder internal rotation 70 AROM in 90 deg abduction  72 deg Shoulder abd 90 deg   Shoulder external rotation 75 AROM in 90 deg abduction  80 deg shoulder abd 90 deg   Elbow flexion      Elbow extension      Wrist flexion      Wrist extension      Wrist ulnar deviation      Wrist radial deviation      Wrist pronation      Wrist supination      (Blank rows = not tested)  UPPER EXTREMITY MMT:  MMT Right Eval 10/24/2024 Left Eval 10/24/2024 11/21/2024  Shoulder flexion 3+/5 5/5 4/5  Shoulder extension     Shoulder abduction 3+/5 5/5 4/5  Shoulder adduction     Shoulder internal rotation 5/5 5/5 5/5  Shoulder external rotation 3/5 5/5 3+/5  Middle trapezius     Lower trapezius     Elbow flexion 5/5 5/5   Elbow extension 5/5 5/5   Wrist flexion     Wrist extension     Wrist ulnar deviation     Wrist radial deviation     Wrist pronation     Wrist supination     Grip strength (lbs)     (Blank rows = not tested)   SHOULDER SPECIAL TESTS: 10/24/2024 (+) rt painful arc in lowering against gravity in elevation.  (-) drop arm Rt  JOINT MOBILITY TESTING:  10/24/2024 Mild posterior glide Rt GH joint restriction.    PALPATION:  10/24/2024 Tenderness, trigger points in Rt infraspinatus, upper trap.  TODAY'S TREATMENT:                                                                                                        DATE:  11/21/2024 TherEx: Sidelying Rt shoulder ER with towel under arm 1 lb weight 2 x 10   Neuro Re-ed (muscle activation, postural awareness/support, coordination)  Rt shoulder ER walk out with towel under arm 5 sec hold x 5 Standing green band rows with scapular retraction focus 2 x 15 Standing green band Gh ext 2 x 15 bilaterally  UBE fwd/back 3 mins each way lvl 3.0 with 1 min rest between directions.   TherActivity (to improve functional reach, lifting, carrying) Supine Wand flexion AAROM x 10  Supine Rt shoulder flexion 1 lb weight x 20 Sidelying Rt shoulder abduction 1 lb weight x 20       TODAY'S TREATMENT:                                                                                                       DATE: 11/08/2024 Therapeutic Exercise: supine AA shoulder flexion c 1 # bar x 15 holding end range 2 sec Side lying shoulder abd: x 15  Side lying shoulder ER c 1 # 2 x 15 ROM updated see above  Manual: Rt posterior glide with mobilization c movement for ER/IR.  Neuromuscular Re-education: Scapular stability in supine: small circles with 1 # weight x 15 each direction Shoulder protraction with flexion x 10 Reactive ER of Rt shoulder using red TB walk outs x 10  TherActivires:  Rows: green TB 2 x 15 holding 3 sec UBE: Level 1.5, push/pull, strengthening, ROM, 3 minutes each direction with 1 minute rest break Shoulder flexion standing ball rolls x 15     TODAY'S TREATMENT:                                                                                                       DATE: 10/31/2024 Therapeutic Exercise:  supine external rotation stretch with 2# weight & 3 deep breaths for hold 2 reps with shoulder ~45* abduction, then 2 reps ~70* abduction, then 2 reps 90* abduction. Supine shoulder flexion with pool noodle bw wrist with slight adduction squeeze  to facilitate shoulder motion without  substitutions 10 reps no wt, then 1# in Rt hand 5 reps.  PT cues on scapula motion.  Supine (scapula retraction prior to motion) biceps curl 2# with supination/palm up 10 reps, neutral pronation/supination 10 reps and pronation/palm down 10 reps Sidelying RUE ER 1# only able to AROM to neutral (hand in front of elbow) 10 reps (no difference in AROM with or without wt) Sidelying RUE ER AAROM up to 45* past neutral. 10 reps Side lying RUE AROM abduction 10 reps  Neuromuscular Re-education: PT demo & verbal cues on upper body posture and shoulder function.  Seated scapular retraction 5 sec hold 10 reps & cervical retraction supine 5 sec hold 10 reps.     TREATMENT:                                                                                                       DATE: 10/24/2024 Therex:    HEP instruction/performance c cues for techniques, handout provided.  Trial set performed of each for comprehension and symptom assessment.  See below for exercise list  Manual: Rt posterior glide with mobilization c movement for ER/IR.    PATIENT EDUCATION: 11/21/2024 Education details: HEP update  Person educated: Patient Education method: Programmer, Multimedia, Demonstration, Verbal cues, and Handouts Education comprehension: verbalized understanding, returned demonstration, and verbal cues required  HOME EXERCISE PROGRAM: Access Code: [email or web address] URL: [email or web address] Date: 11/21/2024 Prepared by: Ozell Silvan  Exercises - Supine Shoulder External Rotation Stretch (Mirrored)  - 1-2 x daily - 7 x weekly - 1 sets - 5-10 reps - 5 hold - Supine Shoulder Internal Rotation  - 1-2 x daily - 7 x weekly - 1 sets - 5-10 reps - 5 hold - Supine Shoulder Flexion Extension Full Range AROM  - 1-2 x daily - 7 x weekly - 1-2 sets - 10-30 reps - Sidelying Shoulder Abduction Palm Forward  - 1-2 x daily - 7 x weekly - 1-2 sets - 10-15 reps - Sidelying Shoulder External Rotation  - 1-2 x daily - 7 x  weekly - 2-3 sets - 10-15 reps - Seated Scapular Retraction  - 3-5 x daily - 7 x weekly - 1 sets - 5-10 reps - 3-5 hold - Standing Row with Anchored Resistance  - 1-2 x daily - 7 x weekly - 2 sets - 10 reps - 3 seconds hold - Sidelying Shoulder ER with Towel and Dumbbell  - 1-2 x daily - 7 x weekly - 2 sets - 10 reps - Shoulder External Rotation Reactive Isometrics (Mirrored)  - 1-2 x daily - 7 x weekly - 1 sets - 10 reps - 5-15 hold  ASSESSMENT:  CLINICAL IMPRESSION: Rt shoulder passive range and gravity reduced range continued to show improvement towards [facility]  Strength testing showed mild gains as noted in objective chart.  Continued skilled PT services with progressive functional strengthening to help improve ability to perform activity at home in elevation movements.   ER most limited strength noted in reassessment again today.   OBJECTIVE IMPAIRMENTS: decreased activity tolerance, decreased  coordination, decreased endurance, decreased mobility, decreased ROM, decreased strength, hypomobility, increased fascial restrictions, impaired perceived functional ability, increased muscle spasms, impaired flexibility, improper body mechanics, postural dysfunction, and pain.   ACTIVITY LIMITATIONS: carrying, lifting, sleeping, and reach over head  PARTICIPATION LIMITATIONS: meal prep, cleaning, laundry, interpersonal relationship, driving, shopping, and community activity  PERSONAL FACTORS: Time since onset of injury/illness/exacerbation and history of surgery on Rt shoulder.  are also affecting patient's functional outcome.   REHAB POTENTIAL: Good  CLINICAL DECISION MAKING: Stable/uncomplicated  EVALUATION COMPLEXITY: Low   GOALS: Goals reviewed with patient? Yes  SHORT TERM GOALS: (target date for Short term goals are 3 weeks 11/14/2024)  1.Patient will demonstrate independent use of home exercise program to maintain progress from in clinic treatments. Goal status:Met 11/08/24  LONG TERM  GOALS: (target dates for all long term goals are 8 weeks  12/19/2024 )   1. Patient will demonstrate/report pain at worst less than or equal to 2/10 to facilitate minimal limitation in daily activity secondary to pain symptoms. Goal status: Ongoing   11/21/2024   2. Patient will demonstrate independent use of home exercise program to facilitate ability to maintain/progress functional gains from skilled physical therapy services. Goal status: Ongoing   11/21/2024   3. Patient will demonstrate Patient specific functional scale avg > or = 8/10 to indicate reduced disability due to condition.  Goal status: Ongoing   11/21/2024   4.  Patient will demonstrate Rt UE MMT 5/5 throughout to facilitate lifting, reaching, carrying at [facility] in daily activity.  Goal status: Ongoing   11/21/2024   5.  Patient will demonstrate Rt GH joint AROM WFL s symptoms to facilitate usual overhead reaching, self care, dressing at PLOF.  Goal status: Ongoing   11/21/2024    PLAN:  PT FREQUENCY: 1-2x/week  PT DURATION: 8 weeks  PLANNED INTERVENTIONS: Can include 02853- PT Re-evaluation, 97110-Therapeutic exercises, 97530- Therapeutic activity, 97112- Neuromuscular re-education, 97535- Self Care, 97140- Manual therapy, [phone]- Gait training, [id number]- Orthotic Fit/training, [phone]- Canalith repositioning, [id number]- Aquatic Therapy, [phone]- Electrical stimulation (unattended), [id number] Physical performance testing, 97016- Vasopneumatic device, [id number]- Ultrasound, [id number]- Traction (mechanical), [id number]- Ionotophoresis 4mg /ml Dexamethasone ,  79439 - Needle insertion w/o injection 1 or 2 muscles, 20561 - Needle insertion w/o injection 3 or more muscles.   Patient/Family education, Balance training, Stair training, Taping, Dry Needling, Joint mobilization, Joint manipulation, Spinal manipulation, Spinal mobilization, Scar mobilization, Vestibular training, Visual/preceptual remediation/compensation, DME instructions, Cryotherapy, and Moist heat.   All performed as medically necessary.  All included unless contraindicated  PLAN FOR NEXT SESSION:  Progressive strengthening for elevation improvements.    Ozell Silvan, PT, DPT, OCS, ATC 11/21/24  10:49 AM     Date of referral: 09/26/2024 Referring provider: Drake Chew, PA-C Referring diagnosis? M67.911 (ICD-10-CM) - Unspecified disorder of synovium and tendon, right shoulder M25.511 (ICD-10-CM) - Pain in right shoulder G89.29 (ICD-10-CM) - Other chronic pain M75.101 (ICD-10-CM) - Unspecified rotator cuff tear or rupture of right shoulder, not specified as traumatic Treatment diagnosis? (if different than referring diagnosis) M25.511, M62.81, R29.3  What was this (referring dx) caused by? Ongoing Issue  Lysle of Condition: Chronic (continuous duration > 3 months)   Laterality: Rt  Current Functional Measure Score: Patient Specific Functional Scale eval avg:    Objective measurements identify impairments when they are compared to normal values, the uninvolved extremity, and prior level of function.  [x]  Yes  []  No  Objective assessment of functional ability: Moderate functional limitations   Briefly describe symptoms: Pt indicated  a fall in 2016 with symptoms.  Reported a surgery on shoulder then with some improvement but still having trouble.  Worsening in June of this year leading to go back to MD about it.  Pt indicated having trouble with getting arm up and lifting items.  Reported having difficulty with those with some pain.  Pt indicated pain moderate in last month.  Some trouble with sleeping on Rt side.  Intermittent trouble noted.   How did symptoms start: Initially 2016, worsened in June 205   Average pain intensity:  Last 24 hours: 5/10  Past week: 5/10  How often does the pt experience symptoms? Intermittently  How much have the symptoms interfered with usual daily activities? Moderately  How has condition changed since care began at this facility?  NA - initial visit  In general, how is the patients overall health? Good   BACK PAIN (STarT Back Screening Tool) No

## 2024-11-28 ENCOUNTER — Encounter: Admitting: Rehabilitative and Restorative Service Providers"

## 2024-11-28 NOTE — Therapy (Incomplete)
 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   Past Surgical History:  Procedure Laterality Date   KNEE ARTHROSCOPY Right 07/13/2019   Procedure: ARTHROSCOPY KNEE, Removeal of chondral lose body, medial menisectomy;  Surgeon: Mardee Lynwood SQUIBB, MD;  Location: [facility];  Service: Orthopedics;  Laterality: Right;   ROTATOR CUFF REPAIR Right    TOOTH EXTRACTION     Patient Active Problem List   Diagnosis Date Noted   Welcome to Medicare preventive visit 05/31/2024   Family history of early CAD 11/21/2022   HTN (hypertension), benign 11/21/2022   Acute intractable headache 11/21/2022   Osteoarthritis 04/22/2022   Exertional dyspnea 04/22/2022   Vaginal atrophy 07/09/2018   Routine general medical examination at a health care facility 02/22/2014   Encounter for annual general medical examination with abnormal findings in adult 02/22/2014    PCP: Gretta Comer BEAL NP  REFERRING PROVIDER: Drake Chew, PA-C  REFERRING DIAG: [phone] (ICD-10-CM) - Unspecified disorder of synovium and tendon, right shoulder M25.511 (ICD-10-CM) - Pain in right shoulder G89.29 (ICD-10-CM) - Other chronic pain M75.101 (ICD-10-CM) - Unspecified rotator cuff tear or rupture of right shoulder, not specified as traumatic  THERAPY DIAG:  No diagnosis found.  Rationale for Evaluation and Treatment: Rehabilitation  ONSET DATE: June 2025 worsening.   SUBJECTIVE:                                                                                                                                                                                      SUBJECTIVE STATEMENT: Pt indicated having some complaints after exercise yesterday.  Reported having no pain upon arrival today.    Reported some better overall.    Rt hand dominant   Reported therapy in August for shoulder prior to MRI.   PERTINENT HISTORY: Previous surgery on Rt shoulder   PAIN:  NPRS scale: at worst 5/10 Pain location: Rt shoulder anterior shoulder joint, Rt upper arm.   Pain description: dull, achy Aggravating factors: lifting, reaching, lying Rt side.  Relieving factors: OTC medicine as needed.   PRECAUTIONS: None  WEIGHT BEARING RESTRICTIONS: No  FALLS:  Has patient fallen in last 6 months? No  LIVING ENVIRONMENT: Lives in: House/apartment  OCCUPATION: Retired recently.   PLOF: Independent, driving, walking 1 mile per day, stretching.  House work. Light yard work.   PATIENT GOALS:Reduce pain, use arm better.    OBJECTIVE:  DIAGNOSTIC FINDINGS:  MRI: Impression: Retracted, full-thickness tear of the right supraspinatus tendon   PATIENT SURVEYS:  Patient-Specific Activity Scoring Scheme  0 represents unable to perform. 10 represents able to perform at prior level. 0 1 2 3 4 5 6 7 8 9  10 (Date and Score)   Activity Eval  10/24/2024  11/21/2024  1. Sleeping on Rt side   5 5   2. Lifting milk jug  3  5  3. Getting dishes in/out of cabinet 2 5  4.    5.    Score 3.33 avg    Total score = sum of the activity scores/number of activities Minimum detectable change (90%CI) for average score = 2 points Minimum detectable change (90%CI) for single activity score = 3 points  COGNITION: 10/24/2024 Overall cognitive status: WFL     SENSATION: 10/24/2024 WFL  POSTURE: 10/24/2024 Rounded shoulders noted.   UPPER EXTREMITY ROM:   ROM Right Eval 10/24/2024 AROM in supine Left 10/31/24 Rt 11/08/24 Active supine Rt 11/21/2024 AROM in supine  Shoulder flexion 155   Able to perform full range against gravity without resistance.   162 deg Pain with descending   168  Shoulder extension      Shoulder abduction      Shoulder adduction      Shoulder  internal rotation 70 AROM in 90 deg abduction  72 deg Shoulder abd 90 deg   Shoulder external rotation 75 AROM in 90 deg abduction  80 deg shoulder abd 90 deg   Elbow flexion      Elbow extension      Wrist flexion      Wrist extension      Wrist ulnar deviation      Wrist radial deviation      Wrist pronation      Wrist supination      (Blank rows = not tested)  UPPER EXTREMITY MMT:  MMT Right Eval 10/24/2024 Left Eval 10/24/2024 11/21/2024  Shoulder flexion 3+/5 5/5 4/5  Shoulder extension     Shoulder abduction 3+/5 5/5 4/5  Shoulder adduction     Shoulder internal rotation 5/5 5/5 5/5  Shoulder external rotation 3/5 5/5 3+/5  Middle trapezius     Lower trapezius     Elbow flexion 5/5 5/5   Elbow extension 5/5 5/5   Wrist flexion     Wrist extension     Wrist ulnar deviation     Wrist radial deviation     Wrist pronation     Wrist supination     Grip strength (lbs)     (Blank rows = not tested)   SHOULDER SPECIAL TESTS: 10/24/2024 (+) rt painful arc in lowering against gravity in elevation.  (-) drop arm Rt  JOINT MOBILITY TESTING:  10/24/2024 Mild posterior glide Rt GH joint restriction.    PALPATION:  10/24/2024 Tenderness, trigger points in Rt infraspinatus, upper trap.  TODAY'S TREATMENT:                                                                                                       DATE:  11/28/2024 TherEx:  Neuro Re-ed (muscle activation, postural awareness/support, coordination)   TherActivity (to improve functional reach, lifting, carrying)     TODAY'S TREATMENT:                                                                                                       DATE:  11/21/2024 TherEx: Sidelying Rt shoulder ER with towel under arm 1 lb weight  2 x 10   Neuro Re-ed (muscle activation, postural awareness/support, coordination)  Rt shoulder ER walk out with towel under arm 5 sec hold x 5 Standing green band rows with scapular retraction focus 2 x 15 Standing green band Gh ext 2 x 15 bilaterally  UBE fwd/back 3 mins each way lvl 3.0 with 1 min rest between directions.   TherActivity (to improve functional reach, lifting, carrying) Supine Wand flexion AAROM x 10  Supine Rt shoulder flexion 1 lb weight x 20 Sidelying Rt shoulder abduction 1 lb weight x 20       TODAY'S TREATMENT:                                                                                                       DATE: 11/08/2024 Therapeutic Exercise: supine AA shoulder flexion c 1 # bar x 15 holding end range 2 sec Side lying shoulder abd: x 15  Side lying shoulder ER c 1 # 2 x 15 ROM updated see above  Manual: Rt posterior glide with mobilization c movement for ER/IR.  Neuromuscular Re-education: Scapular stability in supine: small circles with 1 # weight x 15 each direction Shoulder protraction with flexion x 10 Reactive ER of Rt shoulder using red TB walk outs x 10  TherActivires:  Rows: green TB 2 x 15 holding 3 sec UBE: Level 1.5, push/pull, strengthening, ROM, 3 minutes each direction with 1 minute rest break Shoulder flexion standing ball rolls x 15     TODAY'S TREATMENT:  DATE: 10/31/2024 Therapeutic Exercise:  supine external rotation stretch with 2# weight & 3 deep breaths for hold 2 reps with shoulder ~45* abduction, then 2 reps ~70* abduction, then 2 reps 90* abduction. Supine shoulder flexion with pool noodle bw wrist with slight adduction squeeze to facilitate shoulder motion without substitutions 10 reps no wt, then 1# in Rt hand 5 reps.  PT cues on scapula motion.  Supine (scapula retraction prior to motion) biceps curl 2# with  supination/palm up 10 reps, neutral pronation/supination 10 reps and pronation/palm down 10 reps Sidelying RUE ER 1# only able to AROM to neutral (hand in front of elbow) 10 reps (no difference in AROM with or without wt) Sidelying RUE ER AAROM up to 45* past neutral. 10 reps Side lying RUE AROM abduction 10 reps  Neuromuscular Re-education: PT demo & verbal cues on upper body posture and shoulder function.  Seated scapular retraction 5 sec hold 10 reps & cervical retraction supine 5 sec hold 10 reps.     PATIENT EDUCATION: 11/21/2024 Education details: HEP update  Person educated: Patient Education method: Programmer, Multimedia, Demonstration, Verbal cues, and Handouts Education comprehension: verbalized understanding, returned demonstration, and verbal cues required  HOME EXERCISE PROGRAM: Access Code: [email or web address] URL: [email or web address] Date: 11/21/2024 Prepared by: Ozell Silvan  Exercises - Supine Shoulder External Rotation Stretch (Mirrored)  - 1-2 x daily - 7 x weekly - 1 sets - 5-10 reps - 5 hold - Supine Shoulder Internal Rotation  - 1-2 x daily - 7 x weekly - 1 sets - 5-10 reps - 5 hold - Supine Shoulder Flexion Extension Full Range AROM  - 1-2 x daily - 7 x weekly - 1-2 sets - 10-30 reps - Sidelying Shoulder Abduction Palm Forward  - 1-2 x daily - 7 x weekly - 1-2 sets - 10-15 reps - Sidelying Shoulder External Rotation  - 1-2 x daily - 7 x weekly - 2-3 sets - 10-15 reps - Seated Scapular Retraction  - 3-5 x daily - 7 x weekly - 1 sets - 5-10 reps - 3-5 hold - Standing Row with Anchored Resistance  - 1-2 x daily - 7 x weekly - 2 sets - 10 reps - 3 seconds hold - Sidelying Shoulder ER with Towel and Dumbbell  - 1-2 x daily - 7 x weekly - 2 sets - 10 reps - Shoulder External Rotation Reactive Isometrics (Mirrored)  - 1-2 x daily - 7 x weekly - 1 sets - 10 reps - 5-15 hold  ASSESSMENT:  CLINICAL IMPRESSION: Rt shoulder passive range and gravity reduced range continued  to show improvement towards [facility].  Strength testing showed mild gains as noted in objective chart.  Continued skilled PT services with progressive functional strengthening to help improve ability to perform activity at home in elevation movements.   ER most limited strength noted in reassessment again today.   OBJECTIVE IMPAIRMENTS: decreased activity tolerance, decreased coordination, decreased endurance, decreased mobility, decreased ROM, decreased strength, hypomobility, increased fascial restrictions, impaired perceived functional ability, increased muscle spasms, impaired flexibility, improper body mechanics, postural dysfunction, and pain.   ACTIVITY LIMITATIONS: carrying, lifting, sleeping, and reach over head  PARTICIPATION LIMITATIONS: meal prep, cleaning, laundry, interpersonal relationship, driving, shopping, and community activity  PERSONAL FACTORS: Time since onset of injury/illness/exacerbation and history of surgery on Rt shoulder.  are also affecting patient's functional outcome.   REHAB POTENTIAL: Good  CLINICAL DECISION MAKING: Stable/uncomplicated  EVALUATION COMPLEXITY: Low   GOALS: Goals reviewed with patient? Yes  SHORT TERM GOALS: (  target date for Short term goals are 3 weeks 11/14/2024)  1.Patient will demonstrate independent use of home exercise program to maintain progress from in clinic treatments. Goal status:Met 11/08/24  LONG TERM GOALS: (target dates for all long term goals are 8 weeks  12/19/2024 )   1. Patient will demonstrate/report pain at worst less than or equal to 2/10 to facilitate minimal limitation in daily activity secondary to pain symptoms. Goal status: Ongoing   11/21/2024   2. Patient will demonstrate independent use of home exercise program to facilitate ability to maintain/progress functional gains from skilled physical therapy services. Goal status: Ongoing   11/21/2024   3. Patient will demonstrate Patient specific functional scale avg > or  = 8/10 to indicate reduced disability due to condition.  Goal status: Ongoing   11/21/2024   4.  Patient will demonstrate Rt UE MMT 5/5 throughout to facilitate lifting, reaching, carrying at [facility] in daily activity.  Goal status: Ongoing   11/21/2024   5.  Patient will demonstrate Rt GH joint AROM WFL s symptoms to facilitate usual overhead reaching, self care, dressing at PLOF.  Goal status: Ongoing   11/21/2024    PLAN:  PT FREQUENCY: 1-2x/week  PT DURATION: 8 weeks  PLANNED INTERVENTIONS: Can include 02853- PT Re-evaluation, 97110-Therapeutic exercises, 97530- Therapeutic activity, 97112- Neuromuscular re-education, 97535- Self Care, 97140- Manual therapy, [phone]- Gait training, [phone]- Orthotic Fit/training, [phone]- Canalith repositioning, [id number]- Aquatic Therapy, [phone]- Electrical stimulation (unattended), [id number] Physical performance testing, 97016- Vasopneumatic device, [id number]- Ultrasound, [id number]- Traction (mechanical), [id number]- Ionotophoresis 4mg /ml Dexamethasone ,  79439 - Needle insertion w/o injection 1 or 2 muscles, 20561 - Needle insertion w/o injection 3 or more muscles.   Patient/Family education, Balance training, Stair training, Taping, Dry Needling, Joint mobilization, Joint manipulation, Spinal manipulation, Spinal mobilization, Scar mobilization, Vestibular training, Visual/preceptual remediation/compensation, DME instructions, Cryotherapy, and Moist heat.  All performed as medically necessary.  All included unless contraindicated  PLAN FOR NEXT SESSION:  Progressive strengthening for elevation improvements.    Ozell Silvan, PT, DPT, OCS, ATC 11/28/2024  7:59 AM     Date of referral: 09/26/2024 Referring provider: Drake Chew, PA-C Referring diagnosis? M67.911 (ICD-10-CM) - Unspecified disorder of synovium and tendon, right shoulder M25.511 (ICD-10-CM) - Pain in right shoulder G89.29 (ICD-10-CM) - Other chronic pain M75.101 (ICD-10-CM) - Unspecified rotator cuff tear or  rupture of right shoulder, not specified as traumatic Treatment diagnosis? (if different than referring diagnosis) M25.511, M62.81, R29.3  What was this (referring dx) caused by? Ongoing Issue  Lysle of Condition: Chronic (continuous duration > 3 months)   Laterality: Rt  Current Functional Measure Score: Patient Specific Functional Scale eval avg:    Objective measurements identify impairments when they are compared to normal values, the uninvolved extremity, and prior level of function.  [x]  Yes  []  No  Objective assessment of functional ability: Moderate functional limitations   Briefly describe symptoms: Pt indicated a fall in 2016 with symptoms.  Reported a surgery on shoulder then with some improvement but still having trouble.  Worsening in June of this year leading to go back to MD about it.  Pt indicated having trouble with getting arm up and lifting items.  Reported having difficulty with those with some pain.  Pt indicated pain moderate in last month.  Some trouble with sleeping on Rt side.  Intermittent trouble noted.   How did symptoms start: Initially 2016, worsened in June 205   Average pain intensity:  Last 24 hours: 5/10  Past week: 5/10  How often does the pt experience symptoms? Intermittently  How much have the symptoms interfered with usual daily activities? Moderately  How has condition changed since care began at this facility? NA - initial visit  In general, how is the patients overall health? Good   BACK PAIN (STarT Back Screening Tool) No

## 2024-12-05 ENCOUNTER — Ambulatory Visit (INDEPENDENT_AMBULATORY_CARE_PROVIDER_SITE_OTHER): Admitting: Rehabilitative and Restorative Service Providers"

## 2024-12-05 DIAGNOSIS — G8929 Other chronic pain: Secondary | ICD-10-CM

## 2024-12-05 DIAGNOSIS — R293 Abnormal posture: Secondary | ICD-10-CM | POA: Diagnosis not present

## 2024-12-05 DIAGNOSIS — M25511 Pain in right shoulder: Secondary | ICD-10-CM

## 2024-12-05 DIAGNOSIS — M6281 Muscle weakness (generalized): Secondary | ICD-10-CM | POA: Diagnosis not present

## 2024-12-05 NOTE — Therapy (Signed)
 " OUTPATIENT PHYSICAL THERAPY  TREATMENT   Patient Name: Amy Sims MRN: [id number] DOB:1956/04/01, 68 y.o., female Today's Date: 12/05/2024  END OF SESSION:  PT End of Session - 12/05/24 1036     Visit Number 5    Number of Visits 16    Date for Recertification  12/19/24    Authorization Type [employer] Medicare %45 copay    Progress Note Due on Visit 10    PT Start Time 1013    PT Stop Time 1051    PT Time Calculation (min) 38 min    Activity Tolerance Patient tolerated treatment well    Behavior During Therapy WFL for tasks assessed/performed              Past Medical History:  Diagnosis Date   Generalized headaches    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   Past Surgical History:  Procedure Laterality Date   KNEE ARTHROSCOPY Right 07/13/2019   Procedure: ARTHROSCOPY KNEE, Removeal of chondral lose body, medial menisectomy;  Surgeon: Mardee Lynwood SQUIBB, MD;  Location: [facility];  Service: Orthopedics;  Laterality: Right;   ROTATOR CUFF REPAIR Right    TOOTH EXTRACTION     Patient Active Problem List   Diagnosis Date Noted   Welcome to Medicare preventive visit 05/31/2024   Family history of early CAD 11/21/2022   HTN (hypertension), benign 11/21/2022   Acute intractable headache 11/21/2022   Osteoarthritis 04/22/2022   Exertional dyspnea 04/22/2022   Vaginal atrophy 07/09/2018   Routine general medical examination at a health care facility 02/22/2014   Encounter for annual general medical examination with abnormal findings in adult 02/22/2014    PCP: Gretta Comer BEAL NP  REFERRING PROVIDER: Drake Chew, PA-C  REFERRING DIAG: [phone] (ICD-10-CM) - Unspecified disorder of synovium and tendon, right shoulder M25.511 (ICD-10-CM) - Pain in right shoulder G89.29 (ICD-10-CM) - Other chronic pain M75.101 (ICD-10-CM) - Unspecified rotator cuff tear or rupture of right shoulder, not specified as traumatic  THERAPY DIAG:  Chronic right shoulder  pain  Muscle weakness (generalized)  Abnormal posture  Rationale for Evaluation and Treatment: Rehabilitation  ONSET DATE: June 2025 worsening.   SUBJECTIVE:                                                                                                                                                                                      SUBJECTIVE STATEMENT: Pt indicated having continued complaints of pain. Pt indicated difficulty with reaching, lifting still.  Had concerns about whether she was damaging thing more.   Reported no desire for surgery Indicated she didn't know if tband rows were cause more  pain (indicated not painful during).    Rt hand dominant   Reported therapy in August for shoulder prior to MRI.   PERTINENT HISTORY: Previous surgery on Rt shoulder   PAIN:  NPRS scale: no specific number given today Pain location: Rt shoulder anterior shoulder joint, Rt upper arm.   Pain description: dull, achy Aggravating factors: lifting, reaching, lying Rt side.  Relieving factors: OTC medicine as needed.   PRECAUTIONS: None  WEIGHT BEARING RESTRICTIONS: No  FALLS:  Has patient fallen in last 6 months? No  LIVING ENVIRONMENT: Lives in: House/apartment  OCCUPATION: Retired recently.   PLOF: Independent, driving, walking 1 mile per day, stretching.  House work. Light yard work.   PATIENT GOALS:Reduce pain, use arm better.    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 10 represents able to perform at prior level. 0 1 2 3 4 5 6 7 8 9  10 (Date and Score)   Activity Eval  10/24/2024  11/21/2024 12/05/2024  1. Sleeping on Rt side   5 5  9   2. Lifting milk jug  3  5 4   3. Getting dishes in/out of cabinet 2 5 4   4.     5.     Score 3.33 avg 5 avg 5.667   Total score = sum of the  activity scores/number of activities Minimum detectable change (90%CI) for average score = 2 points Minimum detectable change (90%CI) for single activity score = 3 points  COGNITION: 10/24/2024 Overall cognitive status: WFL     SENSATION: 10/24/2024 WFL  POSTURE: 10/24/2024 Rounded shoulders noted.   UPPER EXTREMITY ROM:   ROM Right Eval 10/24/2024 AROM in supine Left 10/31/24 Rt 11/08/24 Active supine Rt 11/21/2024 AROM in supine  Shoulder flexion 155   Able to perform full range against gravity without resistance.   162 deg Pain with descending   168  Shoulder extension      Shoulder abduction      Shoulder adduction      Shoulder internal rotation 70 AROM in 90 deg abduction  72 deg Shoulder abd 90 deg   Shoulder external rotation 75 AROM in 90 deg abduction  80 deg shoulder abd 90 deg   Elbow flexion      Elbow extension      Wrist flexion      Wrist extension      Wrist ulnar deviation      Wrist radial deviation      Wrist pronation      Wrist supination      (Blank rows = not tested)  UPPER EXTREMITY MMT:  MMT Right Eval 10/24/2024 Left Eval 10/24/2024 11/21/2024 12/05/2024  Shoulder flexion 3+/5 5/5 4/5 4/5  Shoulder extension      Shoulder abduction 3+/5 5/5 4/5 4/5  Shoulder adduction      Shoulder internal rotation 5/5 5/5 5/5 5/5  Shoulder external rotation 3/5 5/5 3+/5 3+/5  Middle trapezius      Lower trapezius      Elbow flexion 5/5 5/5    Elbow extension 5/5 5/5    Wrist flexion      Wrist extension      Wrist ulnar deviation      Wrist radial deviation      Wrist pronation      Wrist supination      Grip strength (lbs)      (  Blank rows = not tested)   SHOULDER SPECIAL TESTS: 12/05/2024: (-) Drop arm Rt (+) rt painful arc   10/24/2024 (+) rt painful arc in lowering against gravity in elevation.  (-) drop arm Rt  JOINT MOBILITY TESTING:  10/24/2024 Mild posterior glide Rt GH joint restriction.    PALPATION:   10/24/2024 Tenderness, trigger points in Rt infraspinatus, upper trap.                                                                                                                                                                                                  TODAY'S TREATMENT:                                                                                                       DATE:  12/05/2024 TherEx: Spent visit in verbal review of exercise intervention with handout review with question and answers about performance techniques and symptom response. Talked through exercise one by one.  Education on importance of performed routine HEP as long as pain symptoms didn't worsen with activity.   Discussed and educated on importance on building periscapular and other rotator cuff strength to help compensation for Rt supraspinatus issues.  Pt voiced reluctance about any surgery intervention, so time spent today in education and emphasis on therapy and HEP to address impairments as able to improve Rt arm function.   Update Handout provided.   TherActivity Cues for managing lifting items with focus on keeping items as close to body as able to improve success and symptoms.  Detailed biomechanics of difference in weight placement in lifting, reaching activity.         TODAY'S TREATMENT:  DATE:  11/21/2024 TherEx: Sidelying Rt shoulder ER with towel under arm 1 lb weight 2 x 10   Neuro Re-ed (muscle activation, postural awareness/support, coordination)  Rt shoulder ER walk out with towel under arm 5 sec hold x 5 Standing green band rows with scapular retraction focus 2 x 15 Standing green band Gh ext 2 x 15 bilaterally  UBE fwd/back 3 mins each way lvl 3.0 with 1 min rest between directions.   TherActivity (to improve functional reach, lifting, carrying) Supine Wand flexion AAROM x 10   Supine Rt shoulder flexion 1 lb weight x 20 Sidelying Rt shoulder abduction 1 lb weight x 20       TODAY'S TREATMENT:                                                                                                       DATE: 11/08/2024 Therapeutic Exercise: supine AA shoulder flexion c 1 # bar x 15 holding end range 2 sec Side lying shoulder abd: x 15  Side lying shoulder ER c 1 # 2 x 15 ROM updated see above  Manual: Rt posterior glide with mobilization c movement for ER/IR.  Neuromuscular Re-education: Scapular stability in supine: small circles with 1 # weight x 15 each direction Shoulder protraction with flexion x 10 Reactive ER of Rt shoulder using red TB walk outs x 10  TherActivires:  Rows: green TB 2 x 15 holding 3 sec UBE: Level 1.5, push/pull, strengthening, ROM, 3 minutes each direction with 1 minute rest break Shoulder flexion standing ball rolls x 15    PATIENT EDUCATION: 11/21/2024 Education details: HEP update  Person educated: Patient Education method: Programmer, Multimedia, Demonstration, Verbal cues, and Handouts Education comprehension: verbalized understanding, returned demonstration, and verbal cues required  HOME EXERCISE PROGRAM: Access Code: [email or web address] URL: [email or web address] Date: 11/21/2024 Prepared by: Ozell Silvan  Exercises - Supine Shoulder External Rotation Stretch (Mirrored)  - 1-2 x daily - 7 x weekly - 1 sets - 5-10 reps - 5 hold - Supine Shoulder Internal Rotation  - 1-2 x daily - 7 x weekly - 1 sets - 5-10 reps - 5 hold - Supine Shoulder Flexion Extension Full Range AROM  - 1-2 x daily - 7 x weekly - 1-2 sets - 10-30 reps - Sidelying Shoulder Abduction Palm Forward  - 1-2 x daily - 7 x weekly - 1-2 sets - 10-15 reps - Sidelying Shoulder External Rotation  - 1-2 x daily - 7 x weekly - 2-3 sets - 10-15 reps - Seated Scapular Retraction  - 3-5 x daily - 7 x weekly - 1 sets - 5-10 reps - 3-5 hold - Standing Row with Anchored  Resistance  - 1-2 x daily - 7 x weekly - 2 sets - 10 reps - 3 seconds hold - Sidelying Shoulder ER with Towel and Dumbbell  - 1-2 x daily - 7 x weekly - 2 sets - 10 reps - Shoulder External Rotation Reactive Isometrics (Mirrored)  - 1-2 x daily - 7 x weekly - 1 sets - 10 reps - 5-15 hold  ASSESSMENT:  CLINICAL IMPRESSION: Pt came in today for appointment with concerns about Rt shoulder symptoms and overall progress.  Pt wanted to spent today talking about exercise intervention and presentation as options for going forward.  Upon reassessment today, PSFS and strength testing were both improved compared to evaluation but similar to last assessment approx. 2-3 weeks ago.  Pt continued to present in Rt Shoulder pain c mobility, muscle strength deficits which impair her use of arm despite mild improvements.  MRI history showing full thickness supraspinatus teat noted and discussed with patient.  Discussed and educated on importance on building periscapular and other rotator cuff strength to help compensation for Rt supraspinatus issues.  Pt voiced reluctance about any surgery intervention, so time spent today in education and emphasis on therapy and HEP to address impairments as able to improve Rt arm function.  Pt was in agreement with plan for continued skilled PT services after holidays.   OBJECTIVE IMPAIRMENTS: decreased activity tolerance, decreased coordination, decreased endurance, decreased mobility, decreased ROM, decreased strength, hypomobility, increased fascial restrictions, impaired perceived functional ability, increased muscle spasms, impaired flexibility, improper body mechanics, postural dysfunction, and pain.   ACTIVITY LIMITATIONS: carrying, lifting, sleeping, and reach over head  PARTICIPATION LIMITATIONS: meal prep, cleaning, laundry, interpersonal relationship, driving, shopping, and community activity  PERSONAL FACTORS: Time since onset of injury/illness/exacerbation and history of  surgery on Rt shoulder.  are also affecting patient's functional outcome.   REHAB POTENTIAL: Good  CLINICAL DECISION MAKING: Stable/uncomplicated  EVALUATION COMPLEXITY: Low   GOALS: Goals reviewed with patient? Yes  SHORT TERM GOALS: (target date for Short term goals are 3 weeks 11/14/2024)  1.Patient will demonstrate independent use of home exercise program to maintain progress from in clinic treatments. Goal status:Met 11/08/24  LONG TERM GOALS: (target dates for all long term goals are 8 weeks  12/19/2024 )   1. Patient will demonstrate/report pain at worst less than or equal to 2/10 to facilitate minimal limitation in daily activity secondary to pain symptoms. Goal status: Ongoing   12/05/2024   2. Patient will demonstrate independent use of home exercise program to facilitate ability to maintain/progress functional gains from skilled physical therapy services. Goal status: Ongoing   12/05/2024   3. Patient will demonstrate Patient specific functional scale avg > or = 8/10 to indicate reduced disability due to condition.  Goal status: Ongoing   12/05/2024   4.  Patient will demonstrate Rt UE MMT 5/5 throughout to facilitate lifting, reaching, carrying at [facility] in daily activity.  Goal status: Ongoing   12/05/2024   5.  Patient will demonstrate Rt GH joint AROM WFL s symptoms to facilitate usual overhead reaching, self care, dressing at PLOF.  Goal status: Ongoing   12/05/2024    PLAN:  PT FREQUENCY: 1-2x/week  PT DURATION: 8 weeks  PLANNED INTERVENTIONS: Can include 02853- PT Re-evaluation, 97110-Therapeutic exercises, 97530- Therapeutic activity, 97112- Neuromuscular re-education, 97535- Self Care, 97140- Manual therapy, [phone]- Gait training, [phone]- Orthotic Fit/training, [phone]- Canalith repositioning, [id number]- Aquatic Therapy, [phone]- Electrical stimulation (unattended), [id number] Physical performance testing, 97016- Vasopneumatic device, [id number]- Ultrasound, [id number]- Traction  (mechanical), [id number]- Ionotophoresis 4mg /ml Dexamethasone ,  79439 - Needle insertion w/o injection 1 or 2 muscles, 20561 - Needle insertion w/o injection 3 or more muscles.   Patient/Family education, Balance training, Stair training, Taping, Dry Needling, Joint mobilization, Joint manipulation, Spinal manipulation, Spinal mobilization, Scar mobilization, Vestibular training, Visual/preceptual remediation/compensation, DME instructions, Cryotherapy, and Moist heat.  All performed as medically necessary.  All included unless contraindicated  PLAN FOR NEXT SESSION:   Will need new [employer] upon return after holidays as most likely not to return prior to recert date.    Asked for Robin with Garrel out of office in Jan.     Ozell Silvan, PT, DPT, OCS, ATC 12/05/2024  11:12 AM     Date of referral: 09/26/2024 Referring provider: Drake Chew, PA-C Referring diagnosis? M67.911 (ICD-10-CM) - Unspecified disorder of synovium and tendon, right shoulder M25.511 (ICD-10-CM) - Pain in right shoulder G89.29 (ICD-10-CM) - Other chronic pain M75.101 (ICD-10-CM) - Unspecified rotator cuff tear or rupture of right shoulder, not specified as traumatic Treatment diagnosis? (if different than referring diagnosis) M25.511, M62.81, R29.3  What was this (referring dx) caused by? Ongoing Issue  Lysle of Condition: Chronic (continuous duration > 3 months)   Laterality: Rt  Current Functional Measure Score: Patient Specific Functional Scale eval avg: 3.33   Objective measurements identify impairments when they are compared to normal values, the uninvolved extremity, and prior level of function.  [x]  Yes  []  No  Objective assessment of functional ability: Moderate functional limitations   Briefly describe symptoms: Pt indicated a fall in 2016 with symptoms.  Reported a surgery on shoulder then with some improvement but still having trouble.  Worsening in June of this year leading to go back to MD about  it.  Pt indicated having trouble with getting arm up and lifting items.  Reported having difficulty with those with some pain.  Pt indicated pain moderate in last month.  Some trouble with sleeping on Rt side.  Intermittent trouble noted.   How did symptoms start: Initially 2016, worsened in June 2025   Average pain intensity:  Last 24 hours: 5/10  Past week: 5/10  How often does the pt experience symptoms? Intermittently  How much have the symptoms interfered with usual daily activities? Moderately  How has condition changed since care began at this facility? NA - initial visit  In general, how is the patients overall health? Good   BACK PAIN (STarT Back Screening Tool) No    "

## 2024-12-26 NOTE — Therapy (Incomplete)
 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   Past Surgical History:  Procedure Laterality Date   KNEE ARTHROSCOPY Right 07/13/2019   Procedure: ARTHROSCOPY KNEE, Removeal of chondral lose body, medial menisectomy;  Surgeon: Mardee Lynwood SQUIBB, MD;  Location: [facility];  Service: Orthopedics;  Laterality: Right;   ROTATOR CUFF REPAIR Right    TOOTH EXTRACTION     Patient Active Problem List   Diagnosis Date Noted   Welcome to Medicare preventive visit 05/31/2024   Family history of early CAD 11/21/2022   HTN (hypertension), benign 11/21/2022   Acute intractable headache 11/21/2022   Osteoarthritis 04/22/2022   Exertional dyspnea 04/22/2022   Vaginal atrophy 07/09/2018   Routine general medical examination at a health care facility 02/22/2014   Encounter for annual general medical examination with abnormal findings in adult 02/22/2014    PCP: Gretta Comer BEAL NP  REFERRING PROVIDER: Drake Chew, PA-C  REFERRING DIAG: [phone] (ICD-10-CM) - Unspecified disorder of synovium and tendon, right shoulder M25.511 (ICD-10-CM) - Pain in right shoulder G89.29 (ICD-10-CM) - Other chronic pain M75.101 (ICD-10-CM) - Unspecified rotator cuff tear or rupture of right shoulder, not specified as traumatic  THERAPY DIAG:  No diagnosis found.  Rationale for Evaluation and Treatment: Rehabilitation  ONSET DATE: June 2025 worsening.   SUBJECTIVE:                                                                                                                                                                                      SUBJECTIVE STATEMENT: *** Pt indicated having continued complaints of pain. Pt indicated difficulty with reaching, lifting  still.  Had concerns about whether she was damaging thing more.   Reported no desire for surgery Indicated she didn't know if tband rows were cause more pain (indicated not painful during).    Rt hand dominant   Reported therapy in August for shoulder prior to MRI.   PERTINENT HISTORY: Previous surgery on Rt shoulder   PAIN:  NPRS scale: no specific number given today *** Pain location: Rt shoulder anterior shoulder joint, Rt upper arm.   Pain description: dull, achy Aggravating factors: lifting, reaching, lying Rt side.  Relieving factors: OTC medicine as needed.   PRECAUTIONS: None  WEIGHT BEARING RESTRICTIONS: No  FALLS:  Has patient fallen in last 6 months? No  LIVING ENVIRONMENT: Lives in: House/apartment  OCCUPATION: Retired recently.  PLOF: Independent, driving, walking 1 mile per day, stretching.  House work. Light yard work.   PATIENT GOALS:Reduce pain, use arm better.    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 10 represents able to perform at prior level. 0 1 2 3 4 5 6 7 8 9  10 (Date and Score)   Activity Eval  10/24/2024  11/21/2024 12/05/2024  1. Sleeping on Rt side   5 5  9   2. Lifting milk jug  3  5 4   3. Getting dishes in/out of cabinet 2 5 4   4.     5.     Score 3.33 avg 5 avg 5.667   Total score = sum of the activity scores/number of activities Minimum detectable change (90%CI) for average score = 2 points Minimum detectable change (90%CI) for single activity score = 3 points  COGNITION: 10/24/2024 Overall cognitive status: WFL     SENSATION: 10/24/2024 WFL  POSTURE: 10/24/2024 Rounded shoulders noted.   UPPER EXTREMITY ROM:   ROM Right Eval 10/24/2024 AROM in supine Left 10/31/24 Rt 11/08/24 Active supine Rt 11/21/2024 AROM in supine   Shoulder flexion 155   Able to perform full range against gravity without resistance.   162 deg Pain with descending   168  Shoulder extension      Shoulder abduction      Shoulder adduction      Shoulder internal rotation 70 AROM in 90 deg abduction  72 deg Shoulder abd 90 deg   Shoulder external rotation 75 AROM in 90 deg abduction  80 deg shoulder abd 90 deg   Elbow flexion      Elbow extension      Wrist flexion      Wrist extension      Wrist ulnar deviation      Wrist radial deviation      Wrist pronation      Wrist supination      (Blank rows = not tested)  UPPER EXTREMITY MMT:  MMT Right Eval 10/24/2024 Left Eval 10/24/2024 11/21/2024 12/05/2024  Shoulder flexion 3+/5 5/5 4/5 4/5  Shoulder extension      Shoulder abduction 3+/5 5/5 4/5 4/5  Shoulder adduction      Shoulder internal rotation 5/5 5/5 5/5 5/5  Shoulder external rotation 3/5 5/5 3+/5 3+/5  Middle trapezius      Lower trapezius      Elbow flexion 5/5 5/5    Elbow extension 5/5 5/5    Wrist flexion      Wrist extension      Wrist ulnar deviation      Wrist radial deviation      Wrist pronation      Wrist supination      Grip strength (lbs)      (Blank rows = not tested)   SHOULDER SPECIAL TESTS: 12/05/2024: (-) Drop arm Rt (+) rt painful arc   10/24/2024 (+) rt painful arc in lowering against gravity in elevation.  (-) drop arm Rt  JOINT MOBILITY TESTING:  10/24/2024 Mild posterior glide Rt GH joint restriction.    PALPATION:  10/24/2024 Tenderness, trigger points in Rt infraspinatus, upper trap.  TODAY'S TREATMENT:  12/27/2024 *** TherEx TherAct   TODAY'S TREATMENT:                                                                                                       DATE:   12/05/2024 TherEx: Spent visit in verbal review of exercise intervention with handout review with question and answers about performance techniques and symptom response. Talked through exercise one by one.  Education on importance of performed routine HEP as long as pain symptoms didn't worsen with activity.   Discussed and educated on importance on building periscapular and other rotator cuff strength to help compensation for Rt supraspinatus issues.  Pt voiced reluctance about any surgery intervention, so time spent today in education and emphasis on therapy and HEP to address impairments as able to improve Rt arm function.   Update Handout provided.   TherActivity Cues for managing lifting items with focus on keeping items as close to body as able to improve success and symptoms.  Detailed biomechanics of difference in weight placement in lifting, reaching activity.   TODAY'S TREATMENT:                                                                                                       DATE:  11/21/2024 TherEx: Sidelying Rt shoulder ER with towel under arm 1 lb weight 2 x 10   Neuro Re-ed (muscle activation, postural awareness/support, coordination)  Rt shoulder ER walk out with towel under arm 5 sec hold x 5 Standing green band rows with scapular retraction focus 2 x 15 Standing green band Gh ext 2 x 15 bilaterally  UBE fwd/back 3 mins each way lvl 3.0 with 1 min rest between directions.   TherActivity (to improve functional reach, lifting, carrying) Supine Wand flexion AAROM x 10  Supine Rt shoulder flexion 1 lb weight x 20 Sidelying Rt shoulder abduction 1 lb weight x 20    TODAY'S TREATMENT:                                                                                                       DATE: 11/08/2024 Therapeutic Exercise: supine AA shoulder flexion c 1 # bar x 15 holding end range 2 sec Side lying  shoulder abd: x 15  Side lying shoulder ER c 1 # 2 x 15 ROM updated see  above  Manual: Rt posterior glide with mobilization c movement for ER/IR.  Neuromuscular Re-education: Scapular stability in supine: small circles with 1 # weight x 15 each direction Shoulder protraction with flexion x 10 Reactive ER of Rt shoulder using red TB walk outs x 10  TherActivires:  Rows: green TB 2 x 15 holding 3 sec UBE: Level 1.5, push/pull, strengthening, ROM, 3 minutes each direction with 1 minute rest break Shoulder flexion standing ball rolls x 15    PATIENT EDUCATION: 11/21/2024 Education details: HEP update  Person educated: Patient Education method: Programmer, Multimedia, Demonstration, Verbal cues, and Handouts Education comprehension: verbalized understanding, returned demonstration, and verbal cues required  HOME EXERCISE PROGRAM: Access Code: [email or web address] URL: [email or web address] Date: 11/21/2024 Prepared by: Ozell Silvan  Exercises - Supine Shoulder External Rotation Stretch (Mirrored)  - 1-2 x daily - 7 x weekly - 1 sets - 5-10 reps - 5 hold - Supine Shoulder Internal Rotation  - 1-2 x daily - 7 x weekly - 1 sets - 5-10 reps - 5 hold - Supine Shoulder Flexion Extension Full Range AROM  - 1-2 x daily - 7 x weekly - 1-2 sets - 10-30 reps - Sidelying Shoulder Abduction Palm Forward  - 1-2 x daily - 7 x weekly - 1-2 sets - 10-15 reps - Sidelying Shoulder External Rotation  - 1-2 x daily - 7 x weekly - 2-3 sets - 10-15 reps - Seated Scapular Retraction  - 3-5 x daily - 7 x weekly - 1 sets - 5-10 reps - 3-5 hold - Standing Row with Anchored Resistance  - 1-2 x daily - 7 x weekly - 2 sets - 10 reps - 3 seconds hold - Sidelying Shoulder ER with Towel and Dumbbell  - 1-2 x daily - 7 x weekly - 2 sets - 10 reps - Shoulder External Rotation Reactive Isometrics (Mirrored)  - 1-2 x daily - 7 x weekly - 1 sets - 10 reps - 5-15 hold  ASSESSMENT:  CLINICAL IMPRESSION: *** Pt came in today for appointment with concerns about Rt shoulder symptoms and overall  progress.  Pt wanted to spent today talking about exercise intervention and presentation as options for going forward.  Upon reassessment today, PSFS and strength testing were both improved compared to evaluation but similar to last assessment approx. 2-3 weeks ago.  Pt continued to present in Rt Shoulder pain c mobility, muscle strength deficits which impair her use of arm despite mild improvements.  MRI history showing full thickness supraspinatus teat noted and discussed with patient.  Discussed and educated on importance on building periscapular and other rotator cuff strength to help compensation for Rt supraspinatus issues.  Pt voiced reluctance about any surgery intervention, so time spent today in education and emphasis on therapy and HEP to address impairments as able to improve Rt arm function.  Pt was in agreement with plan for continued skilled PT services after holidays.   OBJECTIVE IMPAIRMENTS: decreased activity tolerance, decreased coordination, decreased endurance, decreased mobility, decreased ROM, decreased strength, hypomobility, increased fascial restrictions, impaired perceived functional ability, increased muscle spasms, impaired flexibility, improper body mechanics, postural dysfunction, and pain.   ACTIVITY LIMITATIONS: carrying, lifting, sleeping, and reach over head  PARTICIPATION LIMITATIONS: meal prep, cleaning, laundry, interpersonal relationship, driving, shopping, and community activity  PERSONAL FACTORS: Time since onset of injury/illness/exacerbation and history of surgery on Rt shoulder.  are also affecting  patient's functional outcome.   REHAB POTENTIAL: Good  CLINICAL DECISION MAKING: Stable/uncomplicated  EVALUATION COMPLEXITY: Low   GOALS: Goals reviewed with patient? Yes  SHORT TERM GOALS: (target date for Short term goals are 3 weeks 11/14/2024)  1.Patient will demonstrate independent use of home exercise program to maintain progress from in clinic  treatments. Goal status:Met 11/08/24 ***  LONG TERM GOALS: (target dates for all long term goals are 8 weeks  12/19/2024 )   1. Patient will demonstrate/report pain at worst less than or equal to 2/10 to facilitate minimal limitation in daily activity secondary to pain symptoms. Goal status: Ongoing   12/05/2024   2. Patient will demonstrate independent use of home exercise program to facilitate ability to maintain/progress functional gains from skilled physical therapy services. Goal status: Ongoing   12/05/2024   3. Patient will demonstrate Patient specific functional scale avg > or = 8/10 to indicate reduced disability due to condition.  Goal status: Ongoing   12/05/2024   4.  Patient will demonstrate Rt UE MMT 5/5 throughout to facilitate lifting, reaching, carrying at [facility] in daily activity.  Goal status: Ongoing   12/05/2024   5.  Patient will demonstrate Rt GH joint AROM WFL s symptoms to facilitate usual overhead reaching, self care, dressing at PLOF.  Goal status: Ongoing   12/05/2024    PLAN:  PT FREQUENCY: 1-2x/week  PT DURATION: 8 weeks  PLANNED INTERVENTIONS: Can include 02853- PT Re-evaluation, 97110-Therapeutic exercises, 97530- Therapeutic activity, 97112- Neuromuscular re-education, 97535- Self Care, 97140- Manual therapy, [phone]- Gait training, [phone]- Orthotic Fit/training, [phone]- Canalith repositioning, [id number]- Aquatic Therapy, [phone]- Electrical stimulation (unattended), [id number] Physical performance testing, 97016- Vasopneumatic device, [id number]- Ultrasound, [id number]- Traction (mechanical), [id number]- Ionotophoresis 4mg /ml Dexamethasone ,  79439 - Needle insertion w/o injection 1 or 2 muscles, 20561 - Needle insertion w/o injection 3 or more muscles.   Patient/Family education, Balance training, Stair training, Taping, Dry Needling, Joint mobilization, Joint manipulation, Spinal manipulation, Spinal mobilization, Scar mobilization, Vestibular training, Visual/preceptual  remediation/compensation, DME instructions, Cryotherapy, and Moist heat.  All performed as medically necessary.  All included unless contraindicated  PLAN FOR NEXT SESSION:  *** Will need new [employer] upon return after holidays as most likely not to return prior to recert date.    Asked for Grayce with Garrel out of office in Jan.   ***Sign     Date of referral: 09/26/2024 Referring provider: Drake Chew, PA-C Referring diagnosis? M67.911 (ICD-10-CM) - Unspecified disorder of synovium and tendon, right shoulder M25.511 (ICD-10-CM) - Pain in right shoulder G89.29 (ICD-10-CM) - Other chronic pain M75.101 (ICD-10-CM) - Unspecified rotator cuff tear or rupture of right shoulder, not specified as traumatic Treatment diagnosis? (if different than referring diagnosis) M25.511, M62.81, R29.3  What was this (referring dx) caused by? Ongoing Issue  Lysle of Condition: Chronic (continuous duration > 3 months)   Laterality: Rt  Current Functional Measure Score: Patient Specific Functional Scale eval avg: 3.33   Objective measurements identify impairments when they are compared to normal values, the uninvolved extremity, and prior level of function.  [x]  Yes  []  No  Objective assessment of functional ability: Moderate functional limitations   Briefly describe symptoms: Pt indicated a fall in 2016 with symptoms.  Reported a surgery on shoulder then with some improvement but still having trouble.  Worsening in June of this year leading to go back to MD about it.  Pt indicated having trouble with getting arm up and lifting items.  Reported having difficulty with those with some  pain.  Pt indicated pain moderate in last month.  Some trouble with sleeping on Rt side.  Intermittent trouble noted.   How did symptoms start: Initially 2016, worsened in June 2025   Average pain intensity:  Last 24 hours: 5/10  Past week: 5/10  How often does the pt experience symptoms? Intermittently  How much  have the symptoms interfered with usual daily activities? Moderately  How has condition changed since care began at this facility? NA - initial visit  In general, how is the patients overall health? Good   BACK PAIN (STarT Back Screening Tool) No    "

## 2024-12-27 ENCOUNTER — Encounter: Payer: Self-pay | Admitting: Physical Therapy

## 2024-12-27 ENCOUNTER — Ambulatory Visit: Admitting: Physical Therapy

## 2024-12-27 DIAGNOSIS — M25611 Stiffness of right shoulder, not elsewhere classified: Secondary | ICD-10-CM | POA: Diagnosis not present

## 2024-12-27 DIAGNOSIS — M25511 Pain in right shoulder: Secondary | ICD-10-CM | POA: Diagnosis not present

## 2024-12-27 DIAGNOSIS — R293 Abnormal posture: Secondary | ICD-10-CM

## 2024-12-27 DIAGNOSIS — G8929 Other chronic pain: Secondary | ICD-10-CM | POA: Diagnosis not present

## 2024-12-27 DIAGNOSIS — M6281 Muscle weakness (generalized): Secondary | ICD-10-CM | POA: Diagnosis not present

## 2025-01-02 ENCOUNTER — Encounter: Admitting: Physical Therapy

## 2025-01-09 ENCOUNTER — Encounter: Admitting: Physical Therapy

## 2025-01-12 NOTE — Therapy (Incomplete)
 " OUTPATIENT PHYSICAL THERAPY  TREATMENT AND RE-CERTIFICATION / PROGRESS NOTE   Patient Name: Amy Sims MRN: [id number] DOB:December 11, 1956, 69 y.o., female Today's Date: 01/12/2025 Progress Note Reporting Period 10/24/2024 to 12/27/2024  See note below for Objective Data and Assessment of Progress/Goals.     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   Past Surgical History:  Procedure Laterality Date   KNEE ARTHROSCOPY Right 07/13/2019   Procedure: ARTHROSCOPY KNEE, Removeal of chondral lose body, medial menisectomy;  Surgeon: Mardee Lynwood SQUIBB, MD;  Location: [facility];  Service: Orthopedics;  Laterality: Right;   ROTATOR CUFF REPAIR Right    TOOTH EXTRACTION     Patient Active Problem List   Diagnosis Date Noted   Welcome to Medicare preventive visit 05/31/2024   Family history of early CAD 11/21/2022   HTN (hypertension), benign 11/21/2022   Acute intractable headache 11/21/2022   Osteoarthritis 04/22/2022   Exertional dyspnea 04/22/2022   Vaginal atrophy 07/09/2018   Routine general medical examination at a health care facility 02/22/2014   Encounter for annual general medical examination with abnormal findings in adult 02/22/2014    PCP: Gretta Comer BEAL NP  REFERRING PROVIDER: Aron everitt Charlynn Janan Vick LULLA, [place], [place], PA-C left practice)  REFERRING DIAG: [phone] (ICD-10-CM) - Unspecified disorder of synovium and tendon, right shoulder M25.511 (ICD-10-CM) - Pain in right shoulder G89.29 (ICD-10-CM) - Other chronic pain M75.101 (ICD-10-CM) - Unspecified rotator cuff tear or rupture of right shoulder, not specified as traumatic  THERAPY DIAG:  No diagnosis found.  Rationale for Evaluation and Treatment: Rehabilitation  ONSET DATE: June 2025 worsening.   SUBJECTIVE:                                                                                                                                                                                       SUBJECTIVE STATEMENT: *** Pt states that she is feeling fine and that she had not taken any pain medications since last visit. She is still having trouble putting dishes up or other overhead activities. As a result, she is supporting her elbow on the Rt. Arm to support herself during activities. Above shoulder level is where she is noticing weakness and limitations. Patient feels that she has improved with PT, but needs more sessions to address weakness and fatigue.   Rt hand dominant   Reported therapy in August for shoulder prior to MRI.   PERTINENT HISTORY: Previous surgery on Rt shoulder   PAIN: *** NPRS scale: Pt.  is experiencing no pain.  Pain location: Rt shoulder anterior shoulder joint, Rt upper arm.   Pain description: dull, achy Aggravating factors: lifting, reaching, lying Rt side.  Relieving factors: OTC medicine as needed.   PRECAUTIONS: None  WEIGHT BEARING RESTRICTIONS: No  FALLS:  Has patient fallen in last 6 months? No  LIVING ENVIRONMENT: Lives in: House/apartment  OCCUPATION: Retired recently.   PLOF: Independent, driving, walking 1 mile per day, stretching.  House work. Light yard work.   PATIENT GOALS:Reduce pain, use arm better.    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 10 represents able to perform at prior level. 0 1 2 3 4 5 6 7 8 9  10 (Date and Score)   Activity Eval  10/24/2024  11/21/2024 12/05/2024 12/27/2024  1. Sleeping on Rt side   5 5  9 9   2. Lifting milk jug  3  5 4 5   3. Getting dishes in/out of cabinet 2 5 4    4. Using dominant Rt arm for ADL's (dressing, cooking, etc.)    8  5. Doing activities with dominant arm above shoulder height     7  Score  3.33 avg 5 avg 5.667 7.25   Total score = sum of the activity scores/number of activities Minimum detectable change (90%CI) for average score = 2 points Minimum detectable change (90%CI) for single activity score = 3 points  COGNITION: 10/24/2024 Overall cognitive status: WFL     SENSATION: 10/24/2024 WFL  POSTURE: 10/24/2024 Rounded shoulders noted.   UPPER EXTREMITY ROM:   ROM Right Eval 10/24/2024 AROM in supine Left 10/31/24 Rt 11/08/24 Active supine Rt 11/21/2024 AROM in supine Right 12/27/24  Shoulder flexion 155   Able to perform full range against gravity without resistance.   162 deg Pain with descending   168 Seated A: 157* P: 160*  Shoulder extension       Shoulder abduction     Seated A: 97* P: 148*  Shoulder adduction       Shoulder internal rotation 70 AROM in 90 deg abduction  72 deg Shoulder abd 90 deg  Functional reach to T8 Seated with arm against side A:17*  Shoulder external rotation 75 AROM in 90 deg abduction  80 deg shoulder abd 90 deg  Functional reach to C7 Seated with arm 90*: 45*  Elbow flexion       Elbow extension       Wrist flexion       Wrist extension       Wrist ulnar deviation       Wrist radial deviation       Wrist pronation       Wrist supination       (Blank rows = not tested)  UPPER EXTREMITY MMT:  MMT Right Eval 10/24/2024 Left Eval 10/24/2024 11/21/24 12/05/24 12/27/24 left 12/27/24 right  Shoulder flexion 3+/5 5/5 4/5 4/5 4/5 3+/5  Shoulder extension        Shoulder abduction 3+/5 5/5 4/5 4/5 4/5 3+/5  Shoulder adduction        Shoulder internal rotation 5/5 5/5 5/5 5/5 4/5 4/5  Shoulder external rotation 3/5 5/5 3+/5 3+/5 4/5 3+/5  Middle trapezius        Lower trapezius        Elbow flexion 5/5 5/5      Elbow extension 5/5  5/5      Wrist flexion        Wrist extension        Wrist ulnar deviation        Wrist radial deviation        Wrist pronation        Wrist supination        Grip strength  (lbs)        (Blank rows = not tested)   SHOULDER SPECIAL TESTS: 12/05/2024: (-) Drop arm Rt (+) rt painful arc   10/24/2024 (+) rt painful arc in lowering against gravity in elevation.  (-) drop arm Rt  JOINT MOBILITY TESTING:  10/24/2024 Mild posterior glide Rt GH joint restriction.    PALPATION:  10/24/2024 Tenderness, trigger points in Rt infraspinatus, upper trap.                                                                                                                                                                                                 TODAY'S TREATMENT:  *** 01/16/25 TherEx TherAct  TREATMENT:  12/27/2024 TherEx Scifit Bike BUE only; seat 10 6 mins forward, 2 min backward; No resistance Standing Single Arm Rt. Shoulder Abduction; yellow resistance band;  5 x 2; 5 sec hold Standing Rt. Shoulder Flexion ; yellow resistance band;  5 x 2; 5 sec hold Verbal and demonstration review of strengthening exercises for Rt. Shoulder. Given HO. Updated HEP provided.  MMT, AROM, and PROM measurements (see in objective section)   Neuro Re-ed (required verbal and tactile cueing for trunk and shoulder posture, along with coordination) Rt. Shoulder External Rotation Reactive Isometrics;  5 x 2 ; 5 sec hold; towel under arm  Rt.Shoulder External Rotation; yellow resistance band;  5 x 2 ; 5 sec hold Rt. Shoulder Internal Rotation Reactive Isometrics;  5 x 2 ; 5 sec hold towel under arm  Rt.Standing Shoulder Internal Rotation with anchored yellow resistance band;  5 x 2 ; 5 sec hold   TREATMENT:  DATE:  12/05/2024 TherEx: Spent visit in verbal review of exercise intervention with handout review with question and answers about performance techniques and symptom response. Talked through exercise one by one.  Education on importance of performed routine HEP as long as pain  symptoms didn't worsen with activity.   Discussed and educated on importance on building periscapular and other rotator cuff strength to help compensation for Rt supraspinatus issues.  Pt voiced reluctance about any surgery intervention, so time spent today in education and emphasis on therapy and HEP to address impairments as able to improve Rt arm function.   Update Handout provided.   TherActivity Cues for managing lifting items with focus on keeping items as close to body as able to improve success and symptoms.  Detailed biomechanics of difference in weight placement in lifting, reaching activity.  TREATMENT:                                                                                                       DATE:  11/21/2024 TherEx: Sidelying Rt shoulder ER with towel under arm 1 lb weight 2 x 10   Neuro Re-ed (muscle activation, postural awareness/support, coordination)  Rt shoulder ER walk out with towel under arm 5 sec hold x 5 Standing green band rows with scapular retraction focus 2 x 15 Standing green band Gh ext 2 x 15 bilaterally  UBE fwd/back 3 mins each way lvl 3.0 with 1 min rest between directions.   TherActivity (to improve functional reach, lifting, carrying) Supine Wand flexion AAROM x 10  Supine Rt shoulder flexion 1 lb weight x 20 Sidelying Rt shoulder abduction 1 lb weight x 20    PATIENT EDUCATION: 11/21/2024 Education details: HEP update  Person educated: Patient Education method: Programmer, Multimedia, Demonstration, Verbal cues, and Handouts Education comprehension: verbalized understanding, returned demonstration, and verbal cues required  HOME EXERCISE PROGRAM: Access Code: [facility] URL: [email or web address] Date: 12/27/2024 Prepared by: Sherline Babe  Exercises - Seated Scapular Retraction  - 3-5 x daily - 7 x weekly - 1 sets - 5-10 reps - 3-5 hold - Supine Shoulder External Rotation Stretch (Mirrored)  - 1-2 x daily - 7 x weekly - 1 sets -  5-10 reps - 5 hold - Supine Shoulder Internal Rotation  - 1-2 x daily - 7 x weekly - 1 sets - 5-10 reps - 5 hold - Supine Shoulder Flexion Extension Full Range AROM  - 1-2 x daily - 7 x weekly - 1-2 sets - 10-30 reps - Sidelying Shoulder Abduction Palm Forward  - 1-2 x daily - 7 x weekly - 1-2 sets - 10-15 reps - Sidelying Shoulder External Rotation (Mirrored)  - 1-2 x daily - 7 x weekly - 2-3 sets - 10-15 reps - Standing Row with Anchored Resistance  - 1-2 x daily - 7 x weekly - 2 sets - 10 reps - 3 seconds hold - Standing Single Arm Shoulder Abduction with Resistance  - 1-2 x daily - 7 x weekly - 2-3 sets - 5 reps - 5 hold - Standing Shoulder Flexion with Resistance  -  1-2 x daily - 7 x weekly - 2-3 sets - 5 reps - 5 hold - Shoulder External Rotation Reactive Isometrics (Mirrored)  - 1-2 x daily - 7 x weekly - 2-3 sets - 5 reps - 5 hold - Shoulder External Rotation with Anchored Resistance  - 1-2 x daily - 7 x weekly - 2-3 sets - 5 reps - 5 seconds hold - Shoulder Internal Rotation Reactive Isometrics  - 1-2 x daily - 7 x weekly - 2-3 sets - 5 reps - 5 seconds hold - Standing Shoulder Internal Rotation with Anchored Resistance  - 1-2 x daily - 7 x weekly - 2-3 sets - 5 reps - 5 seconds hold  ASSESSMENT:  CLINICAL IMPRESSION: *** Patient presented today with no complaints of pain. She has strength and AROM deficits in the Rt. Shoulder with all shoulder motions. Since last visit before the holidays, she has improved her pain levels and was compliant with the HEP, but she is still struggling with certain activities and fatigue. In session, she demonstrated challenges with overhead reaching in various planes and mentioned challenges with ADLs like hair brushing and dishes. Patient will benefit from continued skilled PT services to improve Rt. Shoulder strength, ROM, and stabilization to improve household activity tolerance.   OBJECTIVE IMPAIRMENTS: decreased activity tolerance, decreased coordination,  decreased endurance, decreased mobility, decreased ROM, decreased strength, hypomobility, increased fascial restrictions, impaired perceived functional ability, increased muscle spasms, impaired flexibility, improper body mechanics, postural dysfunction, and pain.   ACTIVITY LIMITATIONS: carrying, lifting, sleeping, and reach over head  PARTICIPATION LIMITATIONS: meal prep, cleaning, laundry, interpersonal relationship, driving, shopping, and community activity  PERSONAL FACTORS: Time since onset of injury/illness/exacerbation and history of surgery on Rt shoulder.  are also affecting patient's functional outcome.   REHAB POTENTIAL: Good  CLINICAL DECISION MAKING: Stable/uncomplicated  EVALUATION COMPLEXITY: Low   GOALS: *** Goals reviewed with patient? Yes  SHORT TERM GOALS: (target date for Short term goals 01/27/2025)  1.Patient will demonstrate independent use of updated home exercise program to maintain progress from in clinic treatments. Goal status: Ongoing 12/27/2024  2. Patient will demonstrate pain free active overhead reach to retrieve a light object (<5 lbs) from a shelf at chest-to-shoulder level height to improve ADL performance: Ongoing 12/27/2024  LONG TERM GOALS: (target dates for all long term goals are 8 weeks  12/19/2024 )   1. Patient will demonstrate/report pain at worst less than or equal to 2/10 to facilitate minimal limitation in daily activity secondary to pain symptoms. Goal status: Met as of today 12/27/2024    2. Patient will demonstrate independent use of home exercise program to facilitate ability to maintain/progress functional gains from skilled physical therapy services. Goal status: MET with HEP to date but continue to progress 12/27/2024    3. Patient will demonstrate Patient specific functional scale avg > or = 8/10 to indicate reduced disability due to condition.  Goal status: Ongoing (progressing) 12/27/2024    4.  Patient will demonstrate Rt UE MMT  5/5 throughout to facilitate lifting, reaching, carrying at [facility] in daily activity.  Goal status: Ongoing (progressing)   12/27/2024    5.  Patient will demonstrate Rt GH joint AROM WFL s symptoms to facilitate usual overhead reaching, self care, dressing at PLOF.  Goal status: Ongoing  (progressing)  12/27/2024    UPDATED LONG TERM GOALS: (target dates for all long term goals are 8 weeks 02/24/2025 )   1. Patient will continue to demonstrate/report pain at worst less  than or equal to 2/10 to facilitate minimal limitation in daily activity secondary to pain symptoms. Goal status:  Ongoing  12/27/2024   2.Patient demonstrates postural awareness, muscle activation, and coordination with activities using her dominant UE (Rt. Arm): Ongoing 12/27/2024   2. Patient will demonstrate independent use of ongoing home exercise program to facilitate ability to maintain/progress functional gains from skilled physical therapy services. Goal status: Ongoing  12/27/2024    3. Patient will demonstrate Patient specific functional scale avg > or = 8/10 to indicate reduced disability due to condition.  Goal status: Ongoing   12/27/2024    4.  Patient will demonstrate dominant Rt UE MMT 4/5 throughout to facilitate lifting, reaching, carrying at [facility] in daily activity.  Goal status: Ongoing   12/27/2024    5.  Patient will demonstrate Rt GH joint AROM WFL s symptoms to facilitate usual overhead reaching, self care, dressing at PLOF.  Goal status: Ongoing   12/27/2024    PLAN:  PT FREQUENCY: 1x/week  PT DURATION: 8 weeks  PLANNED INTERVENTIONS: Can include 02853- PT Re-evaluation, 97110-Therapeutic exercises, 97530- Therapeutic activity, 97112- Neuromuscular re-education, 97535- Self Care, 97140- Manual therapy, [phone]- Gait training, [phone]- Orthotic Fit/training, [phone]- Canalith repositioning, [id number]- Aquatic Therapy, [phone]- Electrical stimulation (unattended), [id number] Physical performance testing, 97016-  Vasopneumatic device, [id number]- Ultrasound, [id number]- Traction (mechanical), [id number]- Ionotophoresis 4mg /ml Dexamethasone ,  79439 - Needle insertion w/o injection 1 or 2 muscles, 20561 - Needle insertion w/o injection 3 or more muscles.   Patient/Family education, Balance training, Stair training, Taping, Dry Needling, Joint mobilization, Joint manipulation, Spinal manipulation, Spinal mobilization, Scar mobilization, Vestibular training, Visual/preceptual remediation/compensation, DME instructions, Cryotherapy, and Moist heat.  All performed as medically necessary.  All included unless contraindicated  PLAN FOR NEXT SESSION: *** Continue strengthening exercise for Rt shoulder. Continue to progress resistance with overhead functional activities. Follow up with HEP compliance and new exercises.   Asked for Grayce with Garrel out of office in Jan.   SIGN***   Date of referral: 09/26/2024 Referring provider: Aron everitt Charlynn Janan, Vick GAILS, [place], [place], [place] left practice) Referring diagnosis? M67.911 (ICD-10-CM) - Unspecified disorder of synovium and tendon, right shoulder M25.511 (ICD-10-CM) - Pain in right shoulder G89.29 (ICD-10-CM) - Other chronic pain M75.101 (ICD-10-CM) - Unspecified rotator cuff tear or rupture of right shoulder, not specified as traumatic Treatment diagnosis? (if different than referring diagnosis) M25.511, M62.81, R29.3, M25.611  What was this (referring dx) caused by? Ongoing Issue  Lysle of Condition: Chronic (continuous duration > 3 months)   Laterality: Rt  Current Functional Measure Score: Patient Specific Functional Scale 12/27/24 avg: 7.25 on eval was 3.33  Objective measurements identify impairments when they are compared to normal values, the uninvolved extremity, and prior level of function.  [x]  Yes  []  No  Objective assessment of functional ability: Moderate functional limitations   Briefly describe symptoms: 12/27/24 Pt. Continues to have  challenges with ADLs and overhead reaching activities. Pt indicated that pain levels have decreased, but certain rotational movements on her dominant side (Rt. UE) and fatigue are limiting factors for independence in home and in community.   Eval: Pt indicated a fall in 2016 with symptoms.  Reported a surgery on shoulder then with some improvement but still having trouble.  Worsening in June of this year leading to go back to MD about it.  Pt indicated having trouble with getting arm up and lifting items.  Reported having difficulty with those with some pain.  Pt indicated pain moderate  in last month.  Some trouble with sleeping on Rt side.  Intermittent trouble noted.   How did symptoms start: Initially 2016, worsened in June 2025   Average pain intensity:  Last 24 hours: 0/10  Past week: 2/10  How often does the pt experience symptoms? Intermittently  How much have the symptoms interfered with usual daily activities? Moderately  How has condition changed since care began at this facility? A little better  In general, how is the patients overall health? Good   BACK PAIN (STarT Back Screening Tool) No    "

## 2025-01-16 ENCOUNTER — Encounter: Admitting: Physical Therapy

## 2025-01-18 ENCOUNTER — Telehealth: Payer: Self-pay | Admitting: Primary Care

## 2025-01-18 ENCOUNTER — Other Ambulatory Visit: Payer: Self-pay

## 2025-01-18 DIAGNOSIS — I10 Essential (primary) hypertension: Secondary | ICD-10-CM

## 2025-01-18 NOTE — Telephone Encounter (Signed)
 Please call patient:  Has she been taking the metoprolol  succinate?  I do not see it on her current medication list but it looks like it was maybe prescribed by [facility] provider?

## 2025-01-18 NOTE — Telephone Encounter (Signed)
 Copied from CRM [id number]. Topic: Clinical - Medication Refill >> Jan 18, 2025  1:48 PM Suzen RAMAN wrote: Medication: metoprolol  succinate (TOPROL -XL) 25 MG 24 hr tablet   Has the patient contacted their pharmacy? Yes   This is the patient's preferred pharmacy:  [facility] [address] [place] [address] Phone: [phone] Fax: [phone]  Is this the correct pharmacy for this prescription? Yes If no, delete pharmacy and type the correct one.   Has the prescription been filled recently? No  Is the patient out of the medication? Yes  Has the patient been seen for an appointment in the last year OR does the patient have an upcoming appointment? Yes  Can we respond through MyChart? Yes  Agent: Please be advised that Rx refills may take up to 3 business days. We ask that you follow-up with your pharmacy.

## 2025-01-19 ENCOUNTER — Other Ambulatory Visit: Payer: Self-pay

## 2025-01-19 MED ORDER — METOPROLOL SUCCINATE ER 25 MG PO TB24
25.0000 mg | ORAL_TABLET | Freq: Every day | ORAL | 1 refills | Status: AC
  Filled 2025-01-19: qty 90, 90d supply, fill #0

## 2025-01-19 NOTE — Telephone Encounter (Signed)
 Spoke with pt asking about metoprolol  succinate. Pt confirms she's taking it and has 1 pill left. Also, pt states she has CPE with [facility] 06/02/25.

## 2025-01-19 NOTE — Addendum Note (Signed)
 Addended by: Bron Snellings K on: 01/19/2025 01:08 PM   Modules accepted: Orders

## 2025-01-19 NOTE — Telephone Encounter (Signed)
Noted, prescription sent to pharmacy. 

## 2025-01-19 NOTE — Therapy (Incomplete)
 " OUTPATIENT PHYSICAL THERAPY  TREATMENT AND RE-CERTIFICATION / PROGRESS NOTE   Patient Name: Amy Sims MRN: [id number] DOB:19-Aug-1956, 69 y.o., female Today's Date: 01/19/2025 Progress Note Reporting Period 10/24/2024 to 12/27/2024  See note below for Objective Data and Assessment of Progress/Goals.     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   Past Surgical History:  Procedure Laterality Date   KNEE ARTHROSCOPY Right 07/13/2019   Procedure: ARTHROSCOPY KNEE, Removeal of chondral lose body, medial menisectomy;  Surgeon: Mardee Lynwood SQUIBB, MD;  Location: [facility];  Service: Orthopedics;  Laterality: Right;   ROTATOR CUFF REPAIR Right    TOOTH EXTRACTION     Patient Active Problem List   Diagnosis Date Noted   Welcome to Medicare preventive visit 05/31/2024   Family history of early CAD 11/21/2022   HTN (hypertension), benign 11/21/2022   Acute intractable headache 11/21/2022   Osteoarthritis 04/22/2022   Exertional dyspnea 04/22/2022   Vaginal atrophy 07/09/2018   Routine general medical examination at a health care facility 02/22/2014   Encounter for annual general medical examination with abnormal findings in adult 02/22/2014    PCP: Gretta Comer BEAL NP  REFERRING PROVIDER: Aron everitt Charlynn Janan Vick LULLA, [place] Kiester, [place], PA-C left practice)  REFERRING DIAG: [id number] (ICD-10-CM) - Unspecified disorder of synovium and tendon, right shoulder M25.511 (ICD-10-CM) - Pain in right shoulder G89.29 (ICD-10-CM) - Other chronic pain M75.101 (ICD-10-CM) - Unspecified rotator cuff tear or rupture of right shoulder, not specified as traumatic  THERAPY DIAG:  No diagnosis found.  Rationale for Evaluation and Treatment: Rehabilitation  ONSET DATE: June 2025 worsening.   SUBJECTIVE:                                                                                                                                                                                       SUBJECTIVE STATEMENT: *** Pt states that she is feeling fine and that she had not taken any pain medications since last visit. She is still having trouble putting dishes up or other overhead activities. As a result, she is supporting her elbow on the Rt. Arm to support herself during activities. Above shoulder level is where she is noticing weakness and limitations. Patient feels that she has improved with PT, but needs more sessions to address weakness and fatigue.   Rt hand dominant   Reported therapy in August for shoulder prior to MRI.   PERTINENT HISTORY: Previous surgery on Rt shoulder   PAIN: *** NPRS scale: Pt.  is experiencing no pain.  Pain location: Rt shoulder anterior shoulder joint, Rt upper arm.   Pain description: dull, achy Aggravating factors: lifting, reaching, lying Rt side.  Relieving factors: OTC medicine as needed.   PRECAUTIONS: None  WEIGHT BEARING RESTRICTIONS: No  FALLS:  Has patient fallen in last 6 months? No  LIVING ENVIRONMENT: Lives in: House/apartment  OCCUPATION: Retired recently.   PLOF: Independent, driving, walking 1 mile per day, stretching.  House work. Light yard work.   PATIENT GOALS:Reduce pain, use arm better.    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 10 represents able to perform at prior level. 0 1 2 3 4 5 6 7 8 9  10 (Date and Score)   Activity Eval  10/24/2024  11/21/2024 12/05/2024 12/27/2024  1. Sleeping on Rt side   5 5  9 9   2. Lifting milk jug  3  5 4 5   3. Getting dishes in/out of cabinet 2 5 4    4. Using dominant Rt arm for ADL's (dressing, cooking, etc.)    8  5. Doing activities with dominant arm above shoulder height     7  Score 3.33  avg 5 avg 5.667 7.25   Total score = sum of the activity scores/number of activities Minimum detectable change (90%CI) for average score = 2 points Minimum detectable change (90%CI) for single activity score = 3 points  COGNITION: 10/24/2024 Overall cognitive status: WFL     SENSATION: 10/24/2024 WFL  POSTURE: 10/24/2024 Rounded shoulders noted.   UPPER EXTREMITY ROM:   ROM Right Eval 10/24/2024 AROM in supine Left 10/31/24 Rt 11/08/24 Active supine Rt 11/21/2024 AROM in supine Right 12/27/24  Shoulder flexion 155   Able to perform full range against gravity without resistance.   162 deg Pain with descending   168 Seated A: 157* P: 160*  Shoulder extension       Shoulder abduction     Seated A: 97* P: 148*  Shoulder adduction       Shoulder internal rotation 70 AROM in 90 deg abduction  72 deg Shoulder abd 90 deg  Functional reach to T8 Seated with arm against side A:17*  Shoulder external rotation 75 AROM in 90 deg abduction  80 deg shoulder abd 90 deg  Functional reach to C7 Seated with arm 90*: 45*  Elbow flexion       Elbow extension       Wrist flexion       Wrist extension       Wrist ulnar deviation       Wrist radial deviation       Wrist pronation       Wrist supination       (Blank rows = not tested)  UPPER EXTREMITY MMT:  MMT Right Eval 10/24/2024 Left Eval 10/24/2024 11/21/24 12/05/24 12/27/24 left 12/27/24 right  Shoulder flexion 3+/5 5/5 4/5 4/5 4/5 3+/5  Shoulder extension        Shoulder abduction 3+/5 5/5 4/5 4/5 4/5 3+/5  Shoulder adduction        Shoulder internal rotation 5/5 5/5 5/5 5/5 4/5 4/5  Shoulder external rotation 3/5 5/5 3+/5 3+/5 4/5 3+/5  Middle trapezius        Lower trapezius        Elbow flexion 5/5 5/5      Elbow extension 5/5  5/5      Wrist flexion        Wrist extension        Wrist ulnar deviation        Wrist radial deviation        Wrist pronation        Wrist supination        Grip strength (lbs)         (Blank rows = not tested)   SHOULDER SPECIAL TESTS: 12/05/2024: (-) Drop arm Rt (+) rt painful arc   10/24/2024 (+) rt painful arc in lowering against gravity in elevation.  (-) drop arm Rt  JOINT MOBILITY TESTING:  10/24/2024 Mild posterior glide Rt GH joint restriction.    PALPATION:  10/24/2024 Tenderness, trigger points in Rt infraspinatus, upper trap.                                                                                                                                                                                                 TODAY'S TREATMENT:  *** 01/16/25 TherEx TherAct  TREATMENT:  12/27/2024 TherEx Scifit Bike BUE only; seat 10 6 mins forward, 2 min backward; No resistance Standing Single Arm Rt. Shoulder Abduction; yellow resistance band;  5 x 2; 5 sec hold Standing Rt. Shoulder Flexion ; yellow resistance band;  5 x 2; 5 sec hold Verbal and demonstration review of strengthening exercises for Rt. Shoulder. Given HO. Updated HEP provided.  MMT, AROM, and PROM measurements (see in objective section)   Neuro Re-ed (required verbal and tactile cueing for trunk and shoulder posture, along with coordination) Rt. Shoulder External Rotation Reactive Isometrics;  5 x 2 ; 5 sec hold; towel under arm  Rt.Shoulder External Rotation; yellow resistance band;  5 x 2 ; 5 sec hold Rt. Shoulder Internal Rotation Reactive Isometrics;  5 x 2 ; 5 sec hold towel under arm  Rt.Standing Shoulder Internal Rotation with anchored yellow resistance band;  5 x 2 ; 5 sec hold   TREATMENT:  DATE:  12/05/2024 TherEx: Spent visit in verbal review of exercise intervention with handout review with question and answers about performance techniques and symptom response. Talked through exercise one by one.  Education on importance of performed routine HEP as long as pain symptoms  didn't worsen with activity.   Discussed and educated on importance on building periscapular and other rotator cuff strength to help compensation for Rt supraspinatus issues.  Pt voiced reluctance about any surgery intervention, so time spent today in education and emphasis on therapy and HEP to address impairments as able to improve Rt arm function.   Update Handout provided.   TherActivity Cues for managing lifting items with focus on keeping items as close to body as able to improve success and symptoms.  Detailed biomechanics of difference in weight placement in lifting, reaching activity.  TREATMENT:                                                                                                       DATE:  11/21/2024 TherEx: Sidelying Rt shoulder ER with towel under arm 1 lb weight 2 x 10   Neuro Re-ed (muscle activation, postural awareness/support, coordination)  Rt shoulder ER walk out with towel under arm 5 sec hold x 5 Standing green band rows with scapular retraction focus 2 x 15 Standing green band Gh ext 2 x 15 bilaterally  UBE fwd/back 3 mins each way lvl 3.0 with 1 min rest between directions.   TherActivity (to improve functional reach, lifting, carrying) Supine Wand flexion AAROM x 10  Supine Rt shoulder flexion 1 lb weight x 20 Sidelying Rt shoulder abduction 1 lb weight x 20    PATIENT EDUCATION: 11/21/2024 Education details: HEP update  Person educated: Patient Education method: Programmer, Multimedia, Demonstration, Verbal cues, and Handouts Education comprehension: verbalized understanding, returned demonstration, and verbal cues required  HOME EXERCISE PROGRAM: Access Code: [facility] URL: [email or web address] Date: 12/27/2024 Prepared by: Sherline Babe  Exercises - Seated Scapular Retraction  - 3-5 x daily - 7 x weekly - 1 sets - 5-10 reps - 3-5 hold - Supine Shoulder External Rotation Stretch (Mirrored)  - 1-2 x daily - 7 x weekly - 1 sets - 5-10 reps -  5 hold - Supine Shoulder Internal Rotation  - 1-2 x daily - 7 x weekly - 1 sets - 5-10 reps - 5 hold - Supine Shoulder Flexion Extension Full Range AROM  - 1-2 x daily - 7 x weekly - 1-2 sets - 10-30 reps - Sidelying Shoulder Abduction Palm Forward  - 1-2 x daily - 7 x weekly - 1-2 sets - 10-15 reps - Sidelying Shoulder External Rotation (Mirrored)  - 1-2 x daily - 7 x weekly - 2-3 sets - 10-15 reps - Standing Row with Anchored Resistance  - 1-2 x daily - 7 x weekly - 2 sets - 10 reps - 3 seconds hold - Standing Single Arm Shoulder Abduction with Resistance  - 1-2 x daily - 7 x weekly - 2-3 sets - 5 reps - 5 hold - Standing Shoulder Flexion with Resistance  -  1-2 x daily - 7 x weekly - 2-3 sets - 5 reps - 5 hold - Shoulder External Rotation Reactive Isometrics (Mirrored)  - 1-2 x daily - 7 x weekly - 2-3 sets - 5 reps - 5 hold - Shoulder External Rotation with Anchored Resistance  - 1-2 x daily - 7 x weekly - 2-3 sets - 5 reps - 5 seconds hold - Shoulder Internal Rotation Reactive Isometrics  - 1-2 x daily - 7 x weekly - 2-3 sets - 5 reps - 5 seconds hold - Standing Shoulder Internal Rotation with Anchored Resistance  - 1-2 x daily - 7 x weekly - 2-3 sets - 5 reps - 5 seconds hold  ASSESSMENT:  CLINICAL IMPRESSION: *** Patient presented today with no complaints of pain. She has strength and AROM deficits in the Rt. Shoulder with all shoulder motions. Since last visit before the holidays, she has improved her pain levels and was compliant with the HEP, but she is still struggling with certain activities and fatigue. In session, she demonstrated challenges with overhead reaching in various planes and mentioned challenges with ADLs like hair brushing and dishes. Patient will benefit from continued skilled PT services to improve Rt. Shoulder strength, ROM, and stabilization to improve household activity tolerance.   OBJECTIVE IMPAIRMENTS: decreased activity tolerance, decreased coordination, decreased  endurance, decreased mobility, decreased ROM, decreased strength, hypomobility, increased fascial restrictions, impaired perceived functional ability, increased muscle spasms, impaired flexibility, improper body mechanics, postural dysfunction, and pain.   ACTIVITY LIMITATIONS: carrying, lifting, sleeping, and reach over head  PARTICIPATION LIMITATIONS: meal prep, cleaning, laundry, interpersonal relationship, driving, shopping, and community activity  PERSONAL FACTORS: Time since onset of injury/illness/exacerbation and history of surgery on Rt shoulder.  are also affecting patient's functional outcome.   REHAB POTENTIAL: Good  CLINICAL DECISION MAKING: Stable/uncomplicated  EVALUATION COMPLEXITY: Low   GOALS: *** Goals reviewed with patient? Yes  SHORT TERM GOALS: (target date for Short term goals 01/27/2025)  1.Patient will demonstrate independent use of updated home exercise program to maintain progress from in clinic treatments. Goal status: Ongoing 12/27/2024  2. Patient will demonstrate pain free active overhead reach to retrieve a light object (<5 lbs) from a shelf at chest-to-shoulder level height to improve ADL performance: Ongoing 12/27/2024  LONG TERM GOALS: (target dates for all long term goals are 8 weeks  12/19/2024 )   1. Patient will demonstrate/report pain at worst less than or equal to 2/10 to facilitate minimal limitation in daily activity secondary to pain symptoms. Goal status: Met as of today 12/27/2024    2. Patient will demonstrate independent use of home exercise program to facilitate ability to maintain/progress functional gains from skilled physical therapy services. Goal status: MET with HEP to date but continue to progress 12/27/2024    3. Patient will demonstrate Patient specific functional scale avg > or = 8/10 to indicate reduced disability due to condition.  Goal status: Ongoing (progressing) 12/27/2024    4.  Patient will demonstrate Rt UE MMT 5/5  throughout to facilitate lifting, reaching, carrying at [facility] in daily activity.  Goal status: Ongoing (progressing)   12/27/2024    5.  Patient will demonstrate Rt GH joint AROM WFL s symptoms to facilitate usual overhead reaching, self care, dressing at PLOF.  Goal status: Ongoing  (progressing)  12/27/2024    UPDATED LONG TERM GOALS: (target dates for all long term goals are 8 weeks 02/24/2025 )   1. Patient will continue to demonstrate/report pain at worst less  than or equal to 2/10 to facilitate minimal limitation in daily activity secondary to pain symptoms. Goal status:  Ongoing  12/27/2024   2.Patient demonstrates postural awareness, muscle activation, and coordination with activities using her dominant UE (Rt. Arm): Ongoing 12/27/2024   2. Patient will demonstrate independent use of ongoing home exercise program to facilitate ability to maintain/progress functional gains from skilled physical therapy services. Goal status: Ongoing  12/27/2024    3. Patient will demonstrate Patient specific functional scale avg > or = 8/10 to indicate reduced disability due to condition.  Goal status: Ongoing   12/27/2024    4.  Patient will demonstrate dominant Rt UE MMT 4/5 throughout to facilitate lifting, reaching, carrying at [facility] in daily activity.  Goal status: Ongoing   12/27/2024    5.  Patient will demonstrate Rt GH joint AROM WFL s symptoms to facilitate usual overhead reaching, self care, dressing at PLOF.  Goal status: Ongoing   12/27/2024    PLAN:  PT FREQUENCY: 1x/week  PT DURATION: 8 weeks  PLANNED INTERVENTIONS: Can include 02853- PT Re-evaluation, 97110-Therapeutic exercises, 97530- Therapeutic activity, 97112- Neuromuscular re-education, 97535- Self Care, 97140- Manual therapy, [phone]- Gait training, [phone]- Orthotic Fit/training, [phone]- Canalith repositioning, [id number]- Aquatic Therapy, [phone]- Electrical stimulation (unattended), [id number] Physical performance testing, 97016- Vasopneumatic  device, [id number]- Ultrasound, [id number]- Traction (mechanical), [id number]- Ionotophoresis 4mg /ml Dexamethasone ,  79439 - Needle insertion w/o injection 1 or 2 muscles, 20561 - Needle insertion w/o injection 3 or more muscles.   Patient/Family education, Balance training, Stair training, Taping, Dry Needling, Joint mobilization, Joint manipulation, Spinal manipulation, Spinal mobilization, Scar mobilization, Vestibular training, Visual/preceptual remediation/compensation, DME instructions, Cryotherapy, and Moist heat.  All performed as medically necessary.  All included unless contraindicated  PLAN FOR NEXT SESSION: *** Continue strengthening exercise for Rt shoulder. Continue to progress resistance with overhead functional activities. Follow up with HEP compliance and new exercises.   Asked for Grayce with Garrel out of office in Jan.   SIGN***   Date of referral: 09/26/2024 Referring provider: Aron everitt Charlynn Janan, Vick GAILS, [place] Ball, [place], [place] left practice) Referring diagnosis? M67.911 (ICD-10-CM) - Unspecified disorder of synovium and tendon, right shoulder M25.511 (ICD-10-CM) - Pain in right shoulder G89.29 (ICD-10-CM) - Other chronic pain M75.101 (ICD-10-CM) - Unspecified rotator cuff tear or rupture of right shoulder, not specified as traumatic Treatment diagnosis? (if different than referring diagnosis) M25.511, M62.81, R29.3, M25.611  What was this (referring dx) caused by? Ongoing Issue  Lysle of Condition: Chronic (continuous duration > 3 months)   Laterality: Rt  Current Functional Measure Score: Patient Specific Functional Scale 12/27/24 avg: 7.25 on eval was 3.33  Objective measurements identify impairments when they are compared to normal values, the uninvolved extremity, and prior level of function.  [x]  Yes  []  No  Objective assessment of functional ability: Moderate functional limitations   Briefly describe symptoms: 12/27/24 Pt. Continues to have challenges with  ADLs and overhead reaching activities. Pt indicated that pain levels have decreased, but certain rotational movements on her dominant side (Rt. UE) and fatigue are limiting factors for independence in home and in community.   Eval: Pt indicated a fall in 2016 with symptoms.  Reported a surgery on shoulder then with some improvement but still having trouble.  Worsening in June of this year leading to go back to MD about it.  Pt indicated having trouble with getting arm up and lifting items.  Reported having difficulty with those with some pain.  Pt indicated pain moderate  in last month.  Some trouble with sleeping on Rt side.  Intermittent trouble noted.   How did symptoms start: Initially 2016, worsened in June 2025   Average pain intensity:  Last 24 hours: 0/10  Past week: 2/10  How often does the pt experience symptoms? Intermittently  How much have the symptoms interfered with usual daily activities? Moderately  How has condition changed since care began at this facility? A little better  In general, how is the patients overall health? Good   BACK PAIN (STarT Back Screening Tool) No    "

## 2025-01-23 ENCOUNTER — Encounter: Admitting: Physical Therapy

## 2025-01-26 ENCOUNTER — Encounter: Admitting: Physical Therapy

## 2025-01-30 ENCOUNTER — Encounter: Admitting: Physical Therapy

## 2025-02-06 ENCOUNTER — Encounter: Admitting: Physical Therapy

## 2025-06-02 ENCOUNTER — Encounter: Admitting: Primary Care
